# Patient Record
Sex: Male | Born: 1985 | Race: White | Hispanic: No | Smoking: Current every day smoker
Health system: Southern US, Community
[De-identification: ages and names within clinical notes are randomized; demographics above are authoritative.]

## PROBLEM LIST (undated history)

## (undated) HISTORY — PX: BACK SURGERY: SHX140

---

## 2011-02-11 ENCOUNTER — Emergency Department (HOSPITAL_COMMUNITY)
Admission: EM | Admit: 2011-02-11 | Discharge: 2011-02-11 | Disposition: A | Discharge status: Home / Self Care | Payer: Self-pay | Attending: Emergency Medicine | Admitting: Emergency Medicine

## 2011-02-11 ENCOUNTER — Other Ambulatory Visit: Payer: Self-pay

## 2011-02-11 ENCOUNTER — Encounter (HOSPITAL_COMMUNITY): Payer: Self-pay

## 2011-02-11 DIAGNOSIS — R002 Palpitations: Secondary | ICD-10-CM | POA: Insufficient documentation

## 2011-02-11 DIAGNOSIS — R079 Chest pain, unspecified: Secondary | ICD-10-CM | POA: Insufficient documentation
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
## 2011-02-11 NOTE — ED Notes (Signed)
Pt states that he has been having "fast heart beat for a few weeks", pt states that he can not feel the fast rate in his pulse on his wrist but can feel the pounding of the heart at his chest area. Pt states that he will wake up fine and then the palpations will start as the day continues. Pt will have chest pain at times when the palpitations area bad, pt states that he had an ekg at his pcp office two years ago for the same and was told that it was anxiety related, no further testing was done.

## 2011-02-11 NOTE — ED Notes (Signed)
Pt assisted to restroom, tolerated activity well, pt and family updated on plan of care

## 2011-02-11 NOTE — ED Notes (Signed)
Pt presents with a feeling of "heart beating hard" but pt states when he checks his pulse he doesn't feel it in his pulse. Pt states he experiences chest pain during these episodes sometimes.

## 2011-03-07 ENCOUNTER — Emergency Department (HOSPITAL_COMMUNITY)
Admission: EM | Admit: 2011-03-07 | Discharge: 2011-03-07 | Disposition: A | Discharge status: Home / Self Care | Payer: Self-pay

## 2011-03-07 NOTE — ED Notes (Signed)
Called pt in all waiting areas, no answer 

## 2011-03-08 ENCOUNTER — Emergency Department (HOSPITAL_COMMUNITY): Payer: Self-pay

## 2011-03-08 ENCOUNTER — Emergency Department (HOSPITAL_COMMUNITY)
Admission: EM | Admit: 2011-03-08 | Discharge: 2011-03-08 | Disposition: A | Discharge status: Home / Self Care | Payer: Self-pay | Attending: Emergency Medicine | Admitting: Emergency Medicine

## 2011-03-08 ENCOUNTER — Encounter (HOSPITAL_COMMUNITY): Payer: Self-pay | Admitting: *Deleted

## 2011-03-08 DIAGNOSIS — F17200 Nicotine dependence, unspecified, uncomplicated: Secondary | ICD-10-CM | POA: Insufficient documentation

## 2011-03-08 DIAGNOSIS — Y999 Unspecified external cause status: Secondary | ICD-10-CM | POA: Insufficient documentation

## 2011-03-08 DIAGNOSIS — S90129A Contusion of unspecified lesser toe(s) without damage to nail, initial encounter: Secondary | ICD-10-CM | POA: Insufficient documentation

## 2011-03-08 DIAGNOSIS — Z79899 Other long term (current) drug therapy: Secondary | ICD-10-CM | POA: Insufficient documentation

## 2011-03-08 DIAGNOSIS — W208XXA Other cause of strike by thrown, projected or falling object, initial encounter: Secondary | ICD-10-CM | POA: Insufficient documentation

## 2011-03-08 DIAGNOSIS — S90112A Contusion of left great toe without damage to nail, initial encounter: Secondary | ICD-10-CM

## 2011-03-08 DIAGNOSIS — Y92009 Unspecified place in unspecified non-institutional (private) residence as the place of occurrence of the external cause: Secondary | ICD-10-CM | POA: Insufficient documentation

## 2011-03-08 MED ORDER — MECLIZINE HCL 12.5 MG PO TABS: 25.0000 mg | ORAL_TABLET | Freq: Once | ORAL | Status: DC

## 2011-03-08 MED ORDER — ACETAMINOPHEN 500 MG PO TABS
1000.0000 mg | ORAL_TABLET | Freq: Once | ORAL | Status: AC
  Administered 2011-03-08: 1000 mg via ORAL
  Filled 2011-03-08: qty 2

## 2011-03-08 MED ORDER — TRAMADOL-ACETAMINOPHEN 37.5-325 MG PO TABS: ORAL_TABLET | ORAL | Status: AC

## 2011-03-08 MED ORDER — TRAMADOL HCL 50 MG PO TABS
100.0000 mg | ORAL_TABLET | Freq: Once | ORAL | Status: AC
  Administered 2011-03-08: 100 mg via ORAL
  Filled 2011-03-08: qty 2

## 2011-03-08 MED ORDER — IBUPROFEN 800 MG PO TABS
800.0000 mg | ORAL_TABLET | Freq: Once | ORAL | Status: AC
  Administered 2011-03-08: 800 mg via ORAL
  Filled 2011-03-08: qty 1
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
## 2012-06-29 IMAGING — CR DG FOOT COMPLETE 3+V*L*
3 series · 3 of 3 positions shown · non-contrast
Comparison: None.

CLINICAL DATA: Left ankle and foot pain.

LEFT FOOT - COMPLETE 3+ VIEW

[view not recorded (1 of 3)]
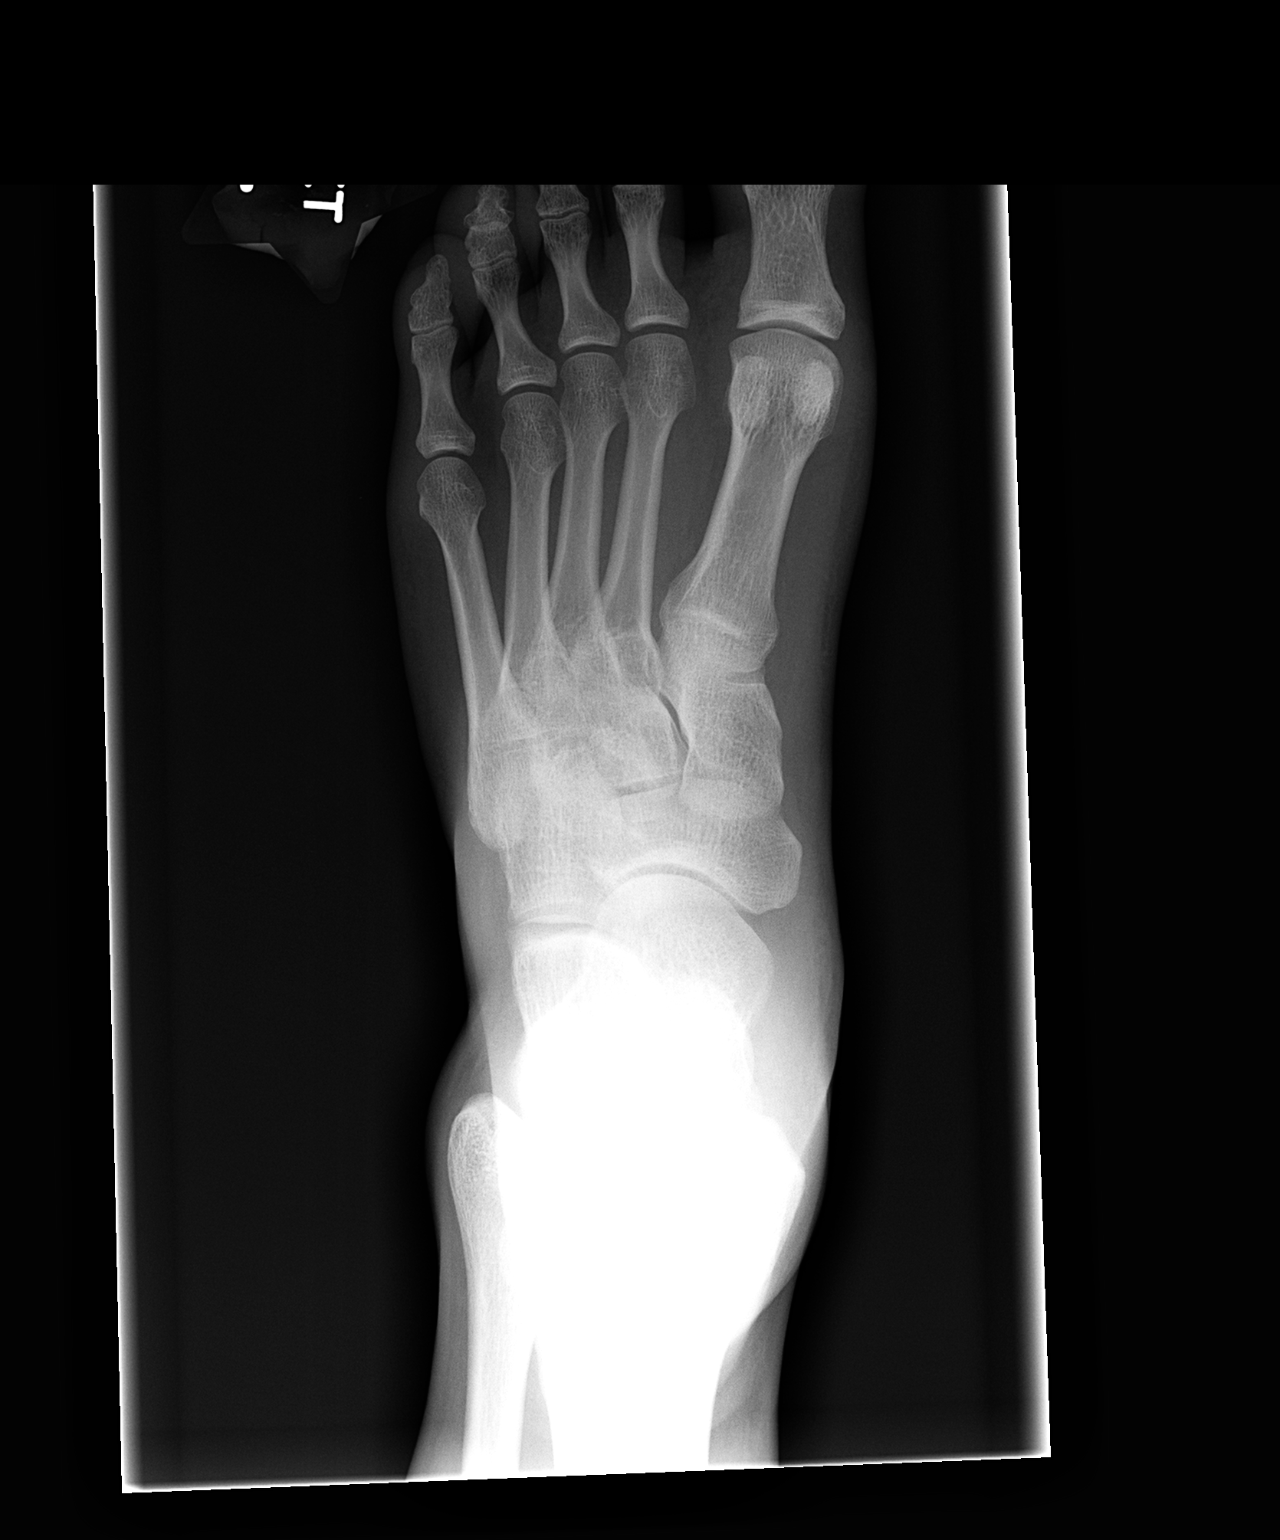

[view not recorded (2 of 3)]
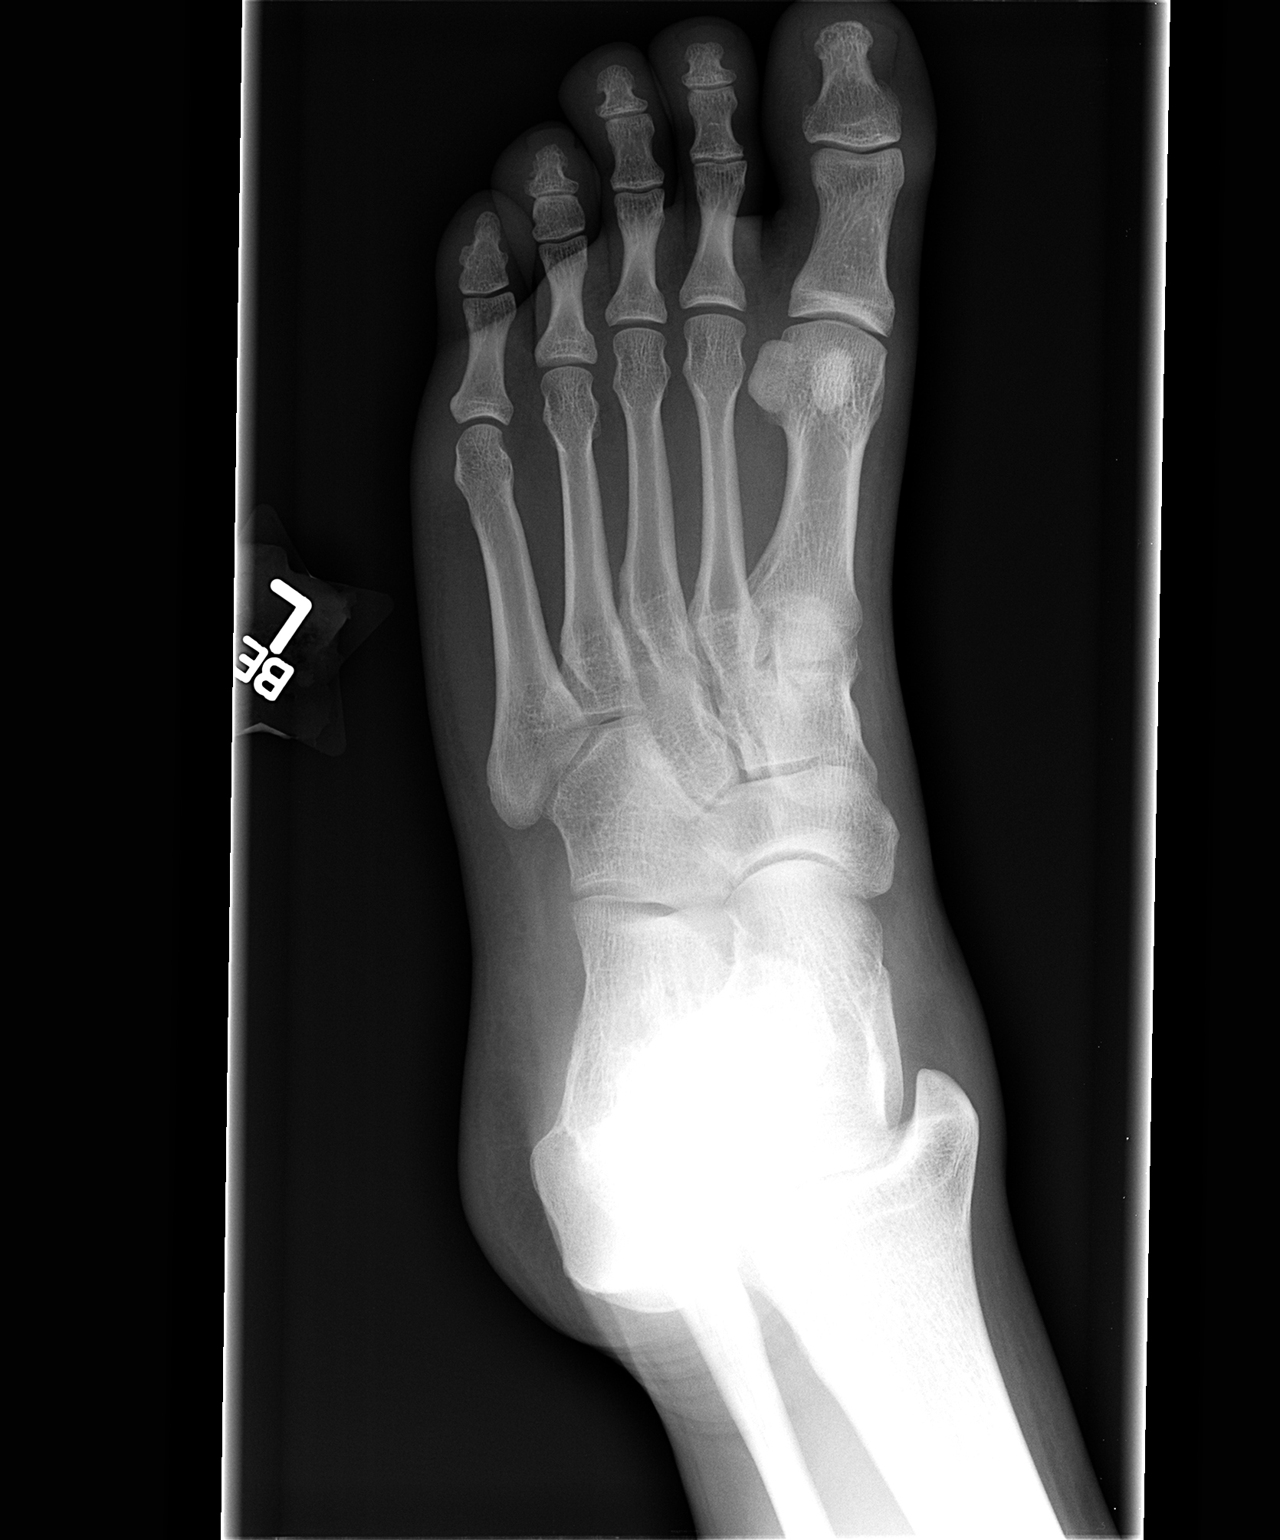

[view not recorded (3 of 3)]
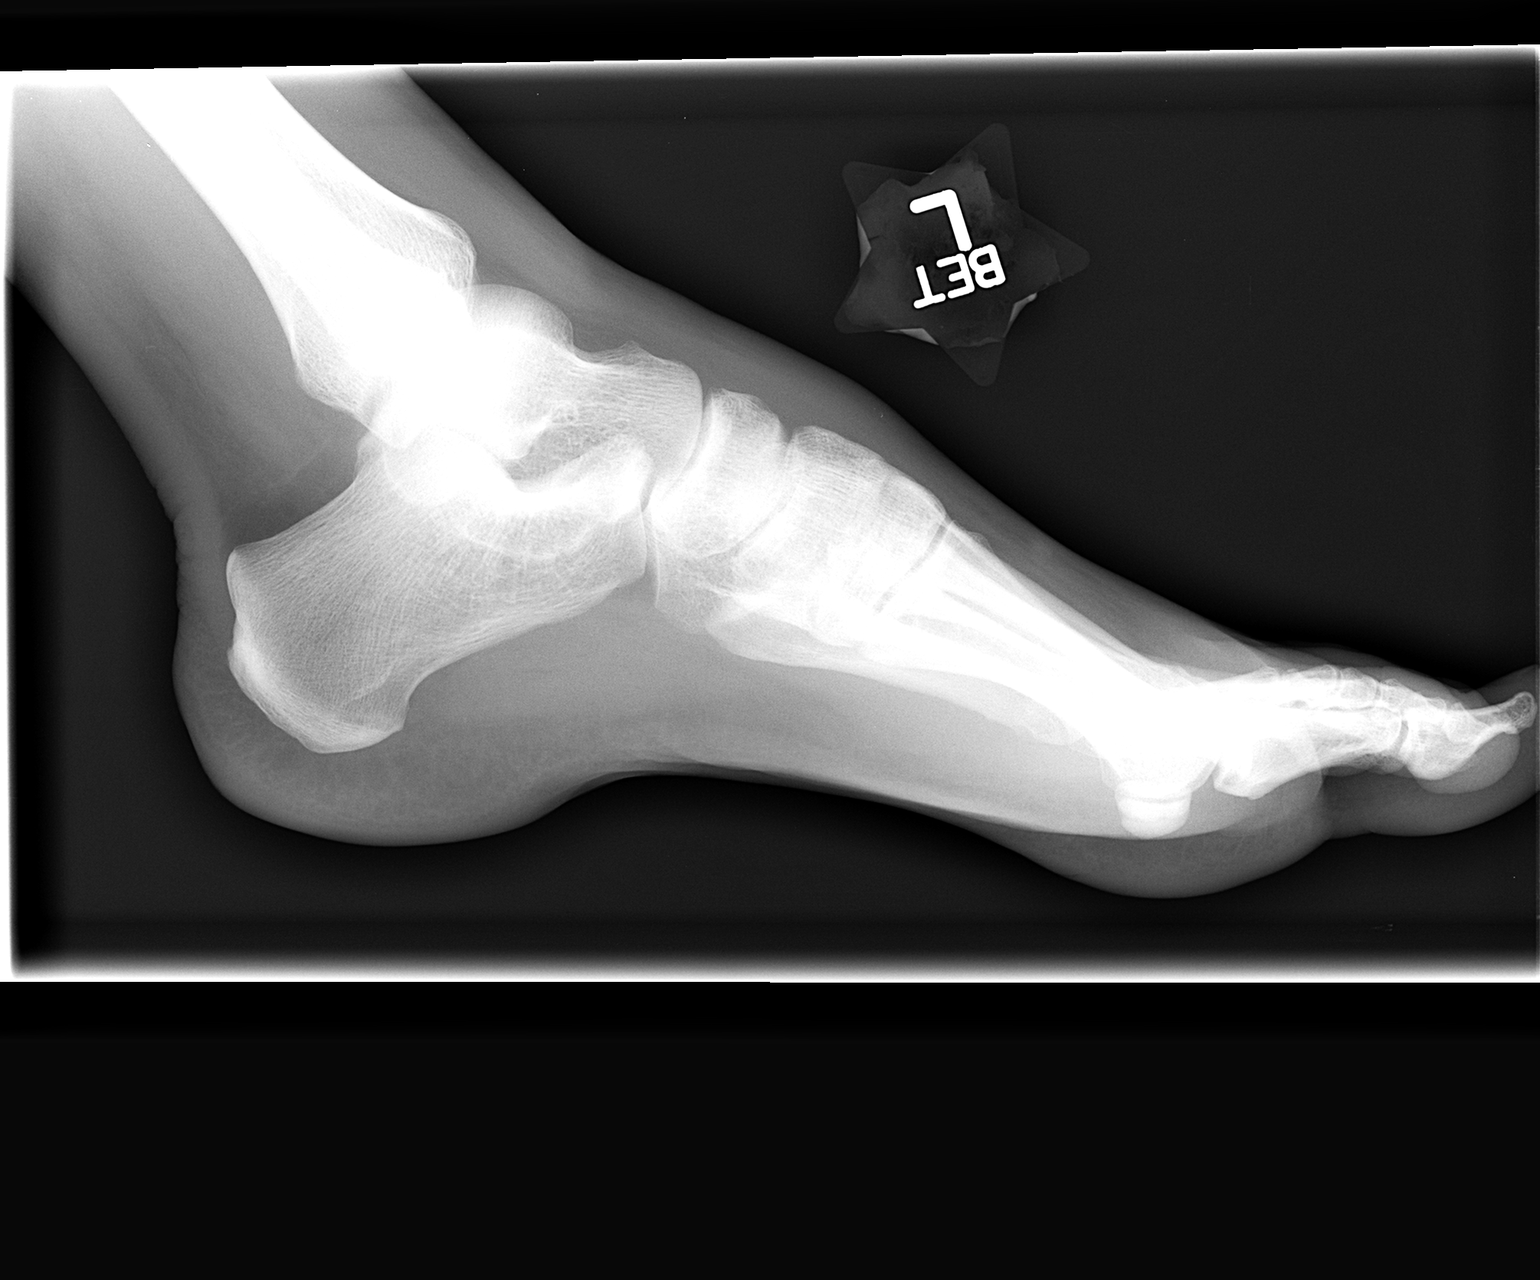

[3 of 3 positions shown; findings below may reference images not displayed]

FINDINGS: No acute fracture or dislocation identified.  There is
mild soft tissue swelling overlying the dorsum of the foot.
IMPRESSION: Mild soft tissue swelling overlying the dorsum of the foot.  No
acute osseous abnormality identified. If clinical concern for a
fracture persists, recommend a repeat radiograph in 5-10 days to
evaluate for interval change or callus formation.

## 2012-10-10 ENCOUNTER — Encounter (HOSPITAL_COMMUNITY): Payer: Self-pay

## 2012-10-10 ENCOUNTER — Emergency Department (HOSPITAL_COMMUNITY)
Admission: EM | Admit: 2012-10-10 | Discharge: 2012-10-10 | Disposition: A | Discharge status: Home / Self Care | Payer: Self-pay | Attending: Emergency Medicine | Admitting: Emergency Medicine

## 2012-10-10 ENCOUNTER — Emergency Department (HOSPITAL_COMMUNITY): Payer: Self-pay

## 2012-10-10 DIAGNOSIS — F17200 Nicotine dependence, unspecified, uncomplicated: Secondary | ICD-10-CM | POA: Insufficient documentation

## 2012-10-10 DIAGNOSIS — M545 Low back pain, unspecified: Secondary | ICD-10-CM | POA: Insufficient documentation

## 2012-10-10 DIAGNOSIS — M436 Torticollis: Secondary | ICD-10-CM | POA: Insufficient documentation

## 2012-10-10 DIAGNOSIS — Z9889 Other specified postprocedural states: Secondary | ICD-10-CM | POA: Insufficient documentation

## 2012-10-10 MED ORDER — OXYCODONE HCL 5 MG PO TABS: 5.0000 mg | ORAL_TABLET | ORAL | Status: DC | PRN

## 2012-10-10 MED ORDER — CYCLOBENZAPRINE HCL 5 MG PO TABS: 5.0000 mg | ORAL_TABLET | Freq: Three times a day (TID) | ORAL | Status: DC | PRN

## 2012-10-10 MED ORDER — PREDNISONE 10 MG PO TABS: ORAL_TABLET | ORAL | Status: DC

## 2012-10-10 NOTE — ED Notes (Signed)
Pt reports neck pain off/on,  Pain returned 2 days ago, denies any new or recent injuries.

## 2012-10-15 NOTE — ED Provider Notes (Signed)
CSN: [id number]     Arrival date & time 10/10/12  1042 History   First MD Initiated Contact with Patient 10/10/12 1104     Chief Complaint  Patient presents with  . Neck Pain   (Consider location/radiation/quality/duration/timing/severity/associated sxs/prior Treatment) Patient is a 27 y.o. male presenting with neck pain. The history is provided by the patient.  Neck Pain Pain location:  L side Quality:  Cramping, stiffness and shooting Stiffness is present:  All day Pain radiates to:  Does not radiate Pain severity:  Moderate Pain is:  Same all the time Onset quality:  Unable to specify (He woke 2 days ago with pain and stiffness) Duration:  2 days Timing:  Constant Progression:  Unchanged Chronicity:  Recurrent (He reports prior episodes of similar neck pain.  He denies injury.) Relieved by:  Nothing Worsened by:  Twisting Ineffective treatments:  NSAIDs Associated symptoms: no fever, no headaches, no numbness, no paresis, no tingling and no weakness   Risk factors: no hx of head and neck radiation, no hx of spinal trauma and no recent head injury     History reviewed. No pertinent past medical history. Past Surgical History  Procedure Laterality Date  . Back surgery     No family history on file. History  Substance Use Topics  . Smoking status: Current Every Day Smoker -- 1.00 packs/day    Types: Cigarettes  . Smokeless tobacco: Not on file  . Alcohol Use: No    Review of Systems  Constitutional: Negative for fever.  HENT: Positive for neck pain.   Musculoskeletal: Positive for back pain. Negative for myalgias.       He reports daily low back pain since surgery years ago which is not new or different then his norm.  Neurological: Negative for tingling, weakness, numbness and headaches.    Allergies  Food  Home Medications   Current Outpatient Rx  Name  Route  Sig  Dispense  Refill  . ibuprofen (ADVIL,MOTRIN) 200 MG tablet   Oral   Take 800 mg by mouth  every 6 (six) hours as needed. For pain         . cyclobenzaprine (FLEXERIL) 5 MG tablet   Oral   Take 1 tablet (5 mg total) by mouth 3 (three) times daily as needed for muscle spasms.   15 tablet   0   . oxyCODONE (ROXICODONE) 5 MG immediate release tablet   Oral   Take 1 tablet (5 mg total) by mouth every 4 (four) hours as needed for pain.   15 tablet   0   . predniSONE (DELTASONE) 10 MG tablet      6, 5, 4, 3, 2 then 1 tablet by mouth daily for 6 days total.   21 tablet   0    BP 133/94  Pulse 90  Temp(Src) 98.2 F (36.8 C) (Oral)  Resp 18  Ht 6\' 1"  (1.854 m)  Wt 185 lb (83.915 kg)  BMI 24.41 kg/m2  SpO2 100% Physical Exam  Nursing note and vitals reviewed. Constitutional: He appears well-developed and well-nourished.  HENT:  Head: Normocephalic.  Eyes: Conjunctivae are normal.  Neck: Muscular tenderness present. No spinous process tenderness present. No rigidity. Decreased range of motion present. No edema and no erythema present.  Pt has reduced cervical ROM which is worsened with left rotation.  TTP left trapezius.  He has equal grip strength. NO midline tenderness.  He can flex and extend which increases pain to a lesser degree.  Cardiovascular: Normal rate and intact distal pulses.   Pedal pulses normal.  Pulmonary/Chest: Effort normal.  Abdominal: Soft. Bowel sounds are normal. He exhibits no distension and no mass.  Musculoskeletal: He exhibits no edema.       Lumbar back: He exhibits tenderness. He exhibits no swelling, no edema and no spasm.  Neurological: He is alert. He has normal strength. He displays no atrophy and no tremor. No sensory deficit. Gait normal.  Reflex Scores:      Patellar reflexes are 2+ on the right side and 2+ on the left side.      Achilles reflexes are 2+ on the right side and 2+ on the left side. No strength deficit noted in hip and knee flexor and extensor muscle groups.  Ankle flexion and extension intact.  Skin: Skin is warm  and dry.  Psychiatric: He has a normal mood and affect.    ED Course  Procedures (including critical care time) Labs Review Labs Reviewed - No data to display Imaging Review No results found.  MDM   1. Torticollis, acute    Patients labs and/or radiological studies were viewed and considered during the medical decision making and disposition process. Pt was prescribed prednisone taper, flexeril and oxycodone,  Encouraged heat therapy followed by ROM to stretch out the muscle spasm.  He requested referral to a local neurosurgeon for f/u care also for his lower back (surgery performed out of area).  Referral given to [facility] for f/u care.    No neuro deficit on exam or by history to suggest emergent or surgical presentation.  Also discussed worsened sx that should prompt immediate re-evaluation including distal weakness or numbness.         Burgess Amor, PA-C 10/15/12 1255

## 2012-10-18 NOTE — ED Provider Notes (Signed)
Medical screening examination/treatment/procedure(s) were performed by non-physician practitioner and as supervising physician I was immediately available for consultation/collaboration. Myran Arcia, MD, FACEP   [address] Laurence Crofford, MD 10/18/12 0844 

## 2013-06-05 ENCOUNTER — Emergency Department (HOSPITAL_COMMUNITY)
Admission: EM | Admit: 2013-06-05 | Discharge: 2013-06-05 | Disposition: A | Discharge status: Home / Self Care | Payer: Self-pay | Attending: Emergency Medicine | Admitting: Emergency Medicine

## 2013-06-05 ENCOUNTER — Encounter (HOSPITAL_COMMUNITY): Payer: Self-pay | Admitting: Emergency Medicine

## 2013-06-05 DIAGNOSIS — Z791 Long term (current) use of non-steroidal anti-inflammatories (NSAID): Secondary | ICD-10-CM | POA: Insufficient documentation

## 2013-06-05 DIAGNOSIS — Z9889 Other specified postprocedural states: Secondary | ICD-10-CM | POA: Insufficient documentation

## 2013-06-05 DIAGNOSIS — F17200 Nicotine dependence, unspecified, uncomplicated: Secondary | ICD-10-CM | POA: Insufficient documentation

## 2013-06-05 DIAGNOSIS — M62838 Other muscle spasm: Secondary | ICD-10-CM | POA: Insufficient documentation

## 2013-06-05 MED ORDER — CYCLOBENZAPRINE HCL 10 MG PO TABS: 10.0000 mg | ORAL_TABLET | Freq: Two times a day (BID) | ORAL | Status: DC | PRN

## 2013-06-05 MED ORDER — IBUPROFEN 800 MG PO TABS: 800.0000 mg | ORAL_TABLET | Freq: Three times a day (TID) | ORAL | Status: DC

## 2013-06-05 NOTE — ED Notes (Signed)
Pt alert & oriented x4, stable gait. Patient given discharge instructions, paperwork & prescription(s). Patient  instructed to stop at the registration desk to finish any additional paperwork. Patient verbalized understanding. Pt left department w/ no further questions. 

## 2013-06-05 NOTE — Discharge Instructions (Signed)
°  Muscle Cramps and Spasms °Muscle cramps and spasms occur when a muscle or muscles tighten and you have no control over this tightening (involuntary muscle contraction). They are a common problem and can develop in any muscle. The most common place is in the calf muscles of the leg. Both muscle cramps and muscle spasms are involuntary muscle contractions, but they also have differences:  °· Muscle cramps are sporadic and painful. They may last a few seconds to a quarter of an hour. Muscle cramps are often more forceful and last longer than muscle spasms. °· Muscle spasms may or may not be painful. They may also last just a few seconds or much longer. °CAUSES  °It is uncommon for cramps or spasms to be due to a serious underlying problem. In many cases, the cause of cramps or spasms is unknown. Some common causes are:  °· Overexertion.   °· Overuse from repetitive motions (doing the same thing over and over).   °· Remaining in a certain position for a long period of time.   °· Improper preparation, form, or technique while performing a sport or activity.   °· Dehydration.   °· Injury.   °· Side effects of some medicines.   °· Abnormally low levels of the salts and ions in your blood (electrolytes), especially potassium and calcium. This could happen if you are taking water pills (diuretics) or you are pregnant.   °Some underlying medical problems can make it more likely to develop cramps or spasms. These include, but are not limited to:  °· Diabetes.   °· Parkinson disease.   °· Hormone disorders, such as thyroid problems.   °· Alcohol abuse.   °· Diseases specific to muscles, joints, and bones.   °· Blood vessel disease where not enough blood is getting to the muscles.   °HOME CARE INSTRUCTIONS  °· Stay well hydrated. Drink enough water and fluids to keep your urine clear or pale yellow. °· It may be helpful to massage, stretch, and relax the affected muscle. °· For tight or tense muscles, use a warm towel, heating  pad, or hot shower water directed to the affected area. °· If you are sore or have pain after a cramp or spasm, applying ice to the affected area may relieve discomfort. °· Put ice in a plastic bag. °· Place a towel between your skin and the bag. °· Leave the ice on for 15-20 minutes, 03-04 times a day. °· Medicines used to treat a known cause of cramps or spasms may help reduce their frequency or severity. Only take over-the-counter or prescription medicines as directed by your caregiver. °SEEK MEDICAL CARE IF:  °Your cramps or spasms get more severe, more frequent, or do not improve over time.  °MAKE SURE YOU:  °· Understand these instructions. °· Will watch your condition. °· Will get help right away if you are not doing well or get worse. °Document Released: 07/09/2001 Document Revised: 05/14/2012 Document Reviewed: 01/04/2012 °[employer]® Patient Information ©2014 [employer]. ° ° °

## 2013-06-05 NOTE — ED Notes (Signed)
Pt c/o left side neck pain that began yesterday while at work. Pt describes pain as tight and sharp.

## 2013-06-05 NOTE — ED Provider Notes (Signed)
CSN: [id number]     Arrival date & time 06/05/13  0911 History   First MD Initiated Contact with Patient 06/05/13 0915     Chief Complaint  Patient presents with  . Neck Pain     (Consider location/radiation/quality/duration/timing/severity/associated sxs/prior Treatment) HPI Comments: Pt c/o left sided neck pain that he noticed at work yesterday and the symptoms worsened thru the night. Pt states the he can't turn his head from side to side. Denies numbness or weakness. No history of drug use. No fever. No recent injury. Has previous history of similar symptoms  The history is provided by the patient. No language interpreter was used.    History reviewed. No pertinent past medical history. Past Surgical History  Procedure Laterality Date  . Back surgery     History reviewed. No pertinent family history. History  Substance Use Topics  . Smoking status: Current Every Day Smoker -- 1.00 packs/day    Types: Cigarettes  . Smokeless tobacco: Not on file  . Alcohol Use: No    Review of Systems  Constitutional: Negative.   Respiratory: Negative.   Cardiovascular: Negative.       Allergies  Food and Tylenol  Home Medications   Prior to Admission medications   Medication Sig Start Date End Date Taking? Authorizing Provider  ibuprofen (ADVIL,MOTRIN) 200 MG tablet Take 800 mg by mouth every 6 (six) hours as needed. For pain   Yes Historical Provider, MD  naproxen sodium (ALEVE) 220 MG tablet Take 440 mg by mouth 2 (two) times daily as needed (pain).   Yes Historical Provider, MD  cyclobenzaprine (FLEXERIL) 10 MG tablet Take 1 tablet (10 mg total) by mouth 2 (two) times daily as needed for muscle spasms. 06/05/13   Teressa LowerVrinda Robert Sunga, NP  ibuprofen (ADVIL,MOTRIN) 800 MG tablet Take 1 tablet (800 mg total) by mouth 3 (three) times daily. 06/05/13   Teressa LowerVrinda Vieno Tarrant, NP   BP 145/98  Pulse 94  Temp(Src) 97.8 F (36.6 C)  Resp 18  SpO2 100% Physical Exam  Nursing note and vitals  reviewed. Constitutional: He is oriented to person, place, and time. He appears well-developed.  Cardiovascular: Normal rate and regular rhythm.   Pulmonary/Chest: Effort normal and breath sounds normal.  Musculoskeletal:  Left paraspinal tender. Unable to turn side to side. No meningeal signs  Neurological: He is alert and oriented to person, place, and time.  Grip strength is equal  Skin: Skin is warm and dry.    ED Course  Procedures (including critical care time) Labs Review Labs Reviewed - No data to display  Imaging Review No results found.   EKG Interpretation None      MDM   Final diagnoses:  Muscle spasm    No neuro deficits. Pt has previous imaging that was normal. Will treat symptomatically and have follow up    Teressa LowerVrinda Rasul Decola, NP 06/05/13 1007

## 2013-06-06 NOTE — ED Provider Notes (Signed)
Medical screening examination/treatment/procedure(s) were performed by non-physician practitioner and as supervising physician I was immediately available for consultation/collaboration.   EKG Interpretation None       Richard HutchingBrian Marisha Renier, MD 06/06/13 1528

## 2014-02-10 ENCOUNTER — Emergency Department (HOSPITAL_COMMUNITY): Payer: Medicaid Other

## 2014-02-10 ENCOUNTER — Encounter (HOSPITAL_COMMUNITY): Payer: Self-pay | Admitting: *Deleted

## 2014-02-10 ENCOUNTER — Emergency Department (HOSPITAL_COMMUNITY)
Admission: EM | Admit: 2014-02-10 | Discharge: 2014-02-10 | Disposition: A | Discharge status: Home / Self Care | Payer: Medicaid Other | Attending: Emergency Medicine | Admitting: Emergency Medicine

## 2014-02-10 DIAGNOSIS — Z79899 Other long term (current) drug therapy: Secondary | ICD-10-CM | POA: Diagnosis not present

## 2014-02-10 DIAGNOSIS — M5412 Radiculopathy, cervical region: Secondary | ICD-10-CM

## 2014-02-10 DIAGNOSIS — Z7952 Long term (current) use of systemic steroids: Secondary | ICD-10-CM | POA: Diagnosis not present

## 2014-02-10 DIAGNOSIS — Z791 Long term (current) use of non-steroidal anti-inflammatories (NSAID): Secondary | ICD-10-CM | POA: Insufficient documentation

## 2014-02-10 DIAGNOSIS — M542 Cervicalgia: Secondary | ICD-10-CM | POA: Diagnosis present

## 2014-02-10 DIAGNOSIS — Z720 Tobacco use: Secondary | ICD-10-CM | POA: Insufficient documentation

## 2014-02-10 DIAGNOSIS — R52 Pain, unspecified: Secondary | ICD-10-CM

## 2014-02-10 MED ORDER — PREDNISONE 10 MG PO TABS
60.0000 mg | ORAL_TABLET | Freq: Once | ORAL | Status: AC
  Administered 2014-02-10: 60 mg via ORAL
  Filled 2014-02-10 (×2): qty 1

## 2014-02-10 MED ORDER — OXYCODONE HCL 5 MG PO TABS
5.0000 mg | ORAL_TABLET | Freq: Once | ORAL | Status: AC
  Administered 2014-02-10: 5 mg via ORAL
  Filled 2014-02-10: qty 1

## 2014-02-10 MED ORDER — METHOCARBAMOL 500 MG PO TABS: 1000.0000 mg | ORAL_TABLET | Freq: Four times a day (QID) | ORAL | Status: AC

## 2014-02-10 MED ORDER — PREDNISONE 10 MG PO TABS: ORAL_TABLET | ORAL | Status: DC

## 2014-02-10 MED ORDER — OXYCODONE HCL 5 MG PO TABS: 5.0000 mg | ORAL_TABLET | ORAL | Status: DC | PRN

## 2014-02-10 MED ORDER — METHOCARBAMOL 500 MG PO TABS
1000.0000 mg | ORAL_TABLET | Freq: Once | ORAL | Status: AC
  Administered 2014-02-10: 1000 mg via ORAL
  Filled 2014-02-10: qty 2

## 2014-02-10 NOTE — ED Provider Notes (Signed)
CSN: [id number]     Arrival date & time 02/10/14  1637 History  This chart was scribed for non-physician practitioner, Burgess Amor, PA-C, working with Linwood Dibbles, MD, by Ronney Lion, ED Scribe. This patient was seen in room APFT21/APFT21 and the patient's care was started at 6:51 PM.    Chief Complaint  Patient presents with  . Neck Pain   The history is provided by the patient. No language interpreter was used.     HPI Comments: Richard Allison is a 29 y.o. male with a history of L4-L5 fusion surgery who presents to the Emergency Department complaining of chronic, constant, waxing and waning neck pain since August 2014. Patient states that his pain has worsened this past week after moving some Christmas decor boxes into a shed this weekend,  now radiating pain down his entire left arm to his fingertips. He reports intermittent numbness in his fingertips.  Turning his head and shrugging his shoulders also exacerbate the pain. Patient has tried Aleve (last taken 5 hours ago) and ibuprofen (last taken 6 hours ago) with mild relief. Patient does not have a PCP. He was seen at the health department, but they denied prescribing any pain medication. Dr. Jacolyn Reedy in [place] was his back surgeon. Patient is an Personnel officer.  History reviewed. No pertinent past medical history. Past Surgical History  Procedure Laterality Date  . Back surgery     History reviewed. No pertinent family history. History  Substance Use Topics  . Smoking status: Current Every Day Smoker -- 1.00 packs/day    Types: Cigarettes  . Smokeless tobacco: Not on file  . Alcohol Use: No    Review of Systems  Constitutional: Negative for fever.  Musculoskeletal: Positive for myalgias, neck pain and neck stiffness.  Neurological: Negative for weakness and numbness.  All other systems reviewed and are negative.     Allergies  Food and Tylenol  Home Medications   Prior to Admission medications   Medication Sig Start  Date End Date Taking? Authorizing Provider  ibuprofen (ADVIL,MOTRIN) 200 MG tablet Take 800 mg by mouth every 6 (six) hours as needed. For pain   Yes Historical Provider, MD  naproxen sodium (ALEVE) 220 MG tablet Take 440 mg by mouth 2 (two) times daily as needed (pain).   Yes Historical Provider, MD  cyclobenzaprine (FLEXERIL) 10 MG tablet Take 1 tablet (10 mg total) by mouth 2 (two) times daily as needed for muscle spasms. Patient not taking: Reported on 02/10/2014 06/05/13   Teressa Lower, NP  ibuprofen (ADVIL,MOTRIN) 800 MG tablet Take 1 tablet (800 mg total) by mouth 3 (three) times daily. Patient not taking: Reported on 02/10/2014 06/05/13   Teressa Lower, NP  methocarbamol (ROBAXIN) 500 MG tablet Take 2 tablets (1,000 mg total) by mouth 4 (four) times daily. 02/10/14 02/20/14  Burgess Amor, PA-C  oxyCODONE (ROXICODONE) 5 MG immediate release tablet Take 1 tablet (5 mg total) by mouth every 4 (four) hours as needed for severe pain. 02/10/14   Burgess Amor, PA-C  predniSONE (DELTASONE) 10 MG tablet 6, 5, 4, 3, 2 then 1 tablet by mouth daily for 6 days total. 02/10/14   Burgess Amor, PA-C   BP 139/83 mmHg  Pulse 92  Temp(Src) 98.6 F (37 C) (Oral)  Resp 18  Ht 6' (1.829 m)  Wt 190 lb (86.183 kg)  BMI 25.76 kg/m2  SpO2 100% Physical Exam  Constitutional: He appears well-developed and well-nourished.  HENT:  Head: Atraumatic.  Neck: Normal range  of motion.  Cardiovascular:  Pulses equal bilaterally  Musculoskeletal: He exhibits tenderness.  Tender to palpation to midline, around C6 to C7 vertebrae, with radiation of pain into left upper shoulder and trapezius. Increased pain with neck rotation.  Neurological: He is alert. He has normal strength. He displays normal reflexes. No sensory deficit.  Slight decreased left grip strength. (Pt right handed). Decreased sensation to light touch in left fingertips sparing thumb. <3 second distal cap refill. 2+ right bicep DTR. 1.5-2+ left bicep DTR.    Skin: Skin is warm and dry.  Psychiatric: He has a normal mood and affect.  Nursing note and vitals reviewed.   ED Course  Procedures (including critical care time)  DIAGNOSTIC STUDIES: Oxygen Saturation is 100% on room air, normal by my interpretation.    COORDINATION OF CARE: 7:01 PM - Discussed treatment plan with pt at bedside which includes Prednisone and muscle relaxers and pt agreed to plan. Patient advised to get an MRI, although he expresses complications due to his insurance status.   Labs Review Labs Reviewed - No data to display  Imaging Review Dg Cervical Spine Complete  02/10/2014   CLINICAL DATA:  Pain in the left posterior aspect of the neck since August of 2015. Pain has been constant since December. No injury until today. Patient was lifting boxes and felt a pulling sensation in the left-sided neck. Pain radiates into the left upper extremity.  EXAM: CERVICAL SPINE  4+ VIEWS  COMPARISON:  10/10/2012  FINDINGS: There is no evidence of cervical spine fracture or prevertebral soft tissue swelling. Alignment is normal. No other significant bone abnormalities are identified.  IMPRESSION: Negative cervical spine radiographs.   Electronically Signed   By: Rosalie GumsBeth  Brown M.D.   On: 02/10/2014 19:50     EKG Interpretation None      MDM   Final diagnoses:  Cervical radiculopathy  Pain   Pt with cervical radiculpathy and weakness/numbness left upper extremity.  Patients labs and/or radiological studies were viewed and considered during the medical decision making and disposition process. Pt was discussed with Dr. Lynelle DoctorKnapp.  Outpatient MRI ordered with planned f/u with his pcp (on medicaid - [facility]).  Referral will need to come from his pcp to neurosurgery if MRI indicates need for specialist.  Pt understands this plan.  He was prescribed prednisone, oxycodone and robaxin.   I personally performed the services described in this documentation, which was scribed in my presence. The  recorded information has been reviewed and is accurate.    Burgess AmorJulie Shandrika Ambers, PA-C 02/11/14 1515  Linwood DibblesJon Knapp, MD 02/12/14 [phone]

## 2014-02-10 NOTE — ED Notes (Signed)
Pain lt side of neck for "months", worse today.  Radiation down lt arm.  Nausea.

## 2014-02-10 NOTE — ED Notes (Signed)
Pt with chronic neck pain since August but helped move some stuff in a shed and neck pain has gotten worse, pt denies seeing anyone for his chronic neck pain

## 2014-02-10 NOTE — Discharge Instructions (Signed)
Cervical Radiculopathy Cervical radiculopathy happens when a nerve in the neck is pinched or bruised by a slipped (herniated) disk or by arthritic changes in the bones of the cervical spine. This can occur due to an injury or as part of the normal aging process. Pressure on the cervical nerves can cause pain or numbness that runs from your neck all the way down into your arm and fingers. CAUSES  There are many possible causes, including:  Injury.  Muscle tightness in the neck from overuse.  Swollen, painful joints (arthritis).  Breakdown or degeneration in the bones and joints of the spine (spondylosis) due to aging.  Bone spurs that may develop near the cervical nerves. SYMPTOMS  Symptoms include pain, weakness, or numbness in the affected arm and hand. Pain can be severe or irritating. Symptoms may be worse when extending or turning the neck. DIAGNOSIS  Your caregiver will ask about your symptoms and do a physical exam. He or she may test your strength and reflexes. X-rays, CT scans, and MRI scans may be needed in cases of injury or if the symptoms do not go away after a period of time. Electromyography (EMG) or nerve conduction testing may be done to study how your nerves and muscles are working. TREATMENT  Your caregiver may recommend certain exercises to help relieve your symptoms. Cervical radiculopathy can, and often does, get better with time and treatment. If your problems continue, treatment options may include:  Wearing a soft collar for short periods of time.  Physical therapy to strengthen the neck muscles.  Medicines, such as nonsteroidal anti-inflammatory drugs (NSAIDs), oral corticosteroids, or spinal injections.  Surgery. Different types of surgery may be done depending on the cause of your problems. HOME CARE INSTRUCTIONS   Put ice on the affected area.  Put ice in a plastic bag.  Place a towel between your skin and the bag.  Leave the ice on for 15-20 minutes,  03-04 times a day or as directed by your caregiver.  If ice does not help, you can try using heat. Take a warm shower or bath, or use a hot water bottle as directed by your caregiver.  You may try a gentle neck and shoulder massage.  Use a flat pillow when you sleep.  Only take over-the-counter or prescription medicines for pain, discomfort, or fever as directed by your caregiver.  If physical therapy was prescribed, follow your caregiver's directions.  If a soft collar was prescribed, use it as directed. SEEK IMMEDIATE MEDICAL CARE IF:   Your pain gets much worse and cannot be controlled with medicines.  You have weakness or numbness in your hand, arm, face, or leg.  You have a high fever or a stiff, rigid neck.  You lose bowel or bladder control (incontinence).  You have trouble with walking, balance, or speaking. MAKE SURE YOU:   Understand these instructions.  Will watch your condition.  Will get help right away if you are not doing well or get worse. Document Released: 10/12/2000 Document Revised: 04/11/2011 Document Reviewed: 08/31/2010 [facility] Patient Information 2015 [employer] [place]. This information is not intended to replace advice given to you by your health care provider. Make sure you discuss any questions you have with your health care provider.  Take your next dose of prednisone tomorrow evening.  Use the the other medicines as directed.  Do not drive within 4 hours of taking oxycodone as this will make you drowsy.  Avoid lifting,  Bending,  Twisting or any  other activity that worsens your pain over the next week.  Apply an  icepack  to your neck for 10-15 minutes every 2 hours for the next 2 days.  You should get rechecked if your symptoms are not better over the next 5 days,  Or you develop increased pain or weakness.  You are being scheduled for an MRI as discussed.  You should be contacted by radiology department to schedule this appointment.

## 2014-02-10 NOTE — ED Notes (Signed)
Pt verbalized understanding of no driving and to use caution within 4 hours of taking pain meds due to meds cause drowsiness 

## 2014-02-18 ENCOUNTER — Ambulatory Visit (HOSPITAL_COMMUNITY): Admission: RE | Admit: 2014-02-18 | Payer: Medicaid Other | Source: Ambulatory Visit

## 2014-05-30 ENCOUNTER — Other Ambulatory Visit (HOSPITAL_COMMUNITY): Payer: Self-pay | Admitting: Family

## 2014-05-30 DIAGNOSIS — M542 Cervicalgia: Secondary | ICD-10-CM

## 2014-06-05 ENCOUNTER — Ambulatory Visit (HOSPITAL_COMMUNITY)
Admission: RE | Admit: 2014-06-05 | Discharge: 2014-06-05 | Disposition: A | Discharge status: Home / Self Care | Payer: Medicaid Other | Source: Ambulatory Visit | Attending: Family | Admitting: Family

## 2014-06-05 DIAGNOSIS — M542 Cervicalgia: Secondary | ICD-10-CM | POA: Diagnosis present

## 2014-06-05 DIAGNOSIS — M4802 Spinal stenosis, cervical region: Secondary | ICD-10-CM | POA: Insufficient documentation

## 2014-06-05 DIAGNOSIS — M25512 Pain in left shoulder: Secondary | ICD-10-CM | POA: Insufficient documentation

## 2014-06-05 DIAGNOSIS — R200 Anesthesia of skin: Secondary | ICD-10-CM | POA: Insufficient documentation

## 2015-05-25 ENCOUNTER — Other Ambulatory Visit (HOSPITAL_COMMUNITY): Payer: Self-pay | Admitting: Sports Medicine

## 2015-05-25 DIAGNOSIS — M545 Low back pain, unspecified: Secondary | ICD-10-CM

## 2015-05-25 DIAGNOSIS — Z981 Arthrodesis status: Secondary | ICD-10-CM

## 2015-05-29 ENCOUNTER — Ambulatory Visit (HOSPITAL_COMMUNITY)
Admission: RE | Admit: 2015-05-29 | Discharge: 2015-05-29 | Disposition: A | Discharge status: Home / Self Care | Payer: Medicaid Other | Source: Ambulatory Visit | Attending: Sports Medicine | Admitting: Sports Medicine

## 2015-05-29 DIAGNOSIS — M545 Low back pain, unspecified: Secondary | ICD-10-CM

## 2015-05-29 DIAGNOSIS — Z981 Arthrodesis status: Secondary | ICD-10-CM | POA: Diagnosis not present

## 2015-05-29 DIAGNOSIS — M5126 Other intervertebral disc displacement, lumbar region: Secondary | ICD-10-CM | POA: Diagnosis not present

## 2015-05-29 MED ORDER — GADOBENATE DIMEGLUMINE 529 MG/ML IV SOLN
18.0000 mL | Freq: Once | INTRAVENOUS | Status: AC | PRN
  Administered 2015-05-29: 18 mL via INTRAVENOUS

## 2015-07-31 ENCOUNTER — Encounter: Payer: Self-pay | Admitting: Physical Medicine & Rehabilitation

## 2015-07-31 ENCOUNTER — Encounter: Payer: Medicaid Other | Attending: Physical Medicine & Rehabilitation | Admitting: Physical Medicine & Rehabilitation

## 2015-07-31 VITALS — BP 156/111 | HR 83 | Resp 14

## 2015-07-31 DIAGNOSIS — Z79899 Other long term (current) drug therapy: Secondary | ICD-10-CM

## 2015-07-31 DIAGNOSIS — M791 Myalgia: Secondary | ICD-10-CM | POA: Diagnosis not present

## 2015-07-31 DIAGNOSIS — G894 Chronic pain syndrome: Secondary | ICD-10-CM

## 2015-07-31 DIAGNOSIS — Z5181 Encounter for therapeutic drug level monitoring: Secondary | ICD-10-CM

## 2015-07-31 DIAGNOSIS — G479 Sleep disorder, unspecified: Secondary | ICD-10-CM | POA: Diagnosis not present

## 2015-07-31 DIAGNOSIS — Z720 Tobacco use: Secondary | ICD-10-CM

## 2015-07-31 DIAGNOSIS — M5126 Other intervertebral disc displacement, lumbar region: Secondary | ICD-10-CM | POA: Diagnosis not present

## 2015-07-31 DIAGNOSIS — F17210 Nicotine dependence, cigarettes, uncomplicated: Secondary | ICD-10-CM | POA: Diagnosis not present

## 2015-07-31 DIAGNOSIS — M4802 Spinal stenosis, cervical region: Secondary | ICD-10-CM | POA: Diagnosis not present

## 2015-07-31 DIAGNOSIS — Z716 Tobacco abuse counseling: Secondary | ICD-10-CM | POA: Insufficient documentation

## 2015-07-31 DIAGNOSIS — Z981 Arthrodesis status: Secondary | ICD-10-CM | POA: Insufficient documentation

## 2015-07-31 DIAGNOSIS — G8929 Other chronic pain: Secondary | ICD-10-CM

## 2015-07-31 DIAGNOSIS — M542 Cervicalgia: Secondary | ICD-10-CM | POA: Insufficient documentation

## 2015-07-31 DIAGNOSIS — M549 Dorsalgia, unspecified: Secondary | ICD-10-CM | POA: Diagnosis present

## 2015-07-31 DIAGNOSIS — M609 Myositis, unspecified: Secondary | ICD-10-CM

## 2015-07-31 DIAGNOSIS — IMO0001 Reserved for inherently not codable concepts without codable children: Secondary | ICD-10-CM | POA: Insufficient documentation

## 2015-07-31 MED ORDER — TRAMADOL HCL 50 MG PO TABS: 100.0000 mg | ORAL_TABLET | Freq: Three times a day (TID) | ORAL | Status: DC | PRN

## 2015-07-31 MED ORDER — AMITRIPTYLINE HCL 150 MG PO TABS: 150.0000 mg | ORAL_TABLET | Freq: Every day | ORAL | Status: DC

## 2015-07-31 MED ORDER — DULOXETINE HCL 30 MG PO CPEP: 30.0000 mg | ORAL_CAPSULE | Freq: Every day | ORAL | Status: DC

## 2015-07-31 NOTE — Progress Notes (Signed)
Subjective:    Patient ID: Richard Allison, male    DOB: 08-May-1985, 30 y.o.   MRN: [id number]  HPI  30 y/o male with pmh of tobacco abuse, back pain s/p lumbar fusion presents neck > back pain.  Neck pain located on left side.  Back pain on right side.  Started in 2001 after heavy lifting.  Back surgery was in 2009. Neck is getting progressively worse since 2012.  Prolonged activity exacerbate the pain.  Chiropractor improves the pain. Sharp pain.  Pain radiates to elbow.  Before going to the chiropractor is travel down to his hand.  Today, 8/10.  Constant.  Ice helps.  Flexaril helps pt go to sleep.  Baclofen didn't seen to help, but only took one.  Denies associated numbness, tingling, weakness.   This has resulted in poor sleep. It limits him from doing daily activities and working. It also limits his interactions with others. He notes a decrease in appetite.  Pt had C-spine MRI 06/2014 and L-spine 05/2015.  Pain Inventory Average Pain 8 Pain Right Now 8 My pain is sharp, burning, dull, stabbing and aching  In the last 24 hours, has pain interfered with the following? General activity 9 Relation with others 9 Enjoyment of life 10 What TIME of day is your pain at its worst? morning Sleep (in general) NA  Pain is worse with: bending, sitting, inactivity and some activites Pain improves with: rest and medication Relief from Meds: 9  Mobility walk without assistance how many minutes can you walk? 30 ability to climb steps?  yes do you drive?  yes Do you have any goals in this area?  yes  Function employed # of hrs/week open I need assistance with the following:  dressing, toileting and household duties Do you have any goals in this area?  yes  Neuro/Psych spasms depression anxiety  Prior Studies x-rays CT/MRI  Physicians involved in your care [facility]   Family History  Problem Relation Age of  Onset  . Heart disease Father   . Diabetes Father   . Hyperlipidemia Father    Social History   Social History  . Marital Status: Legally Separated    Spouse Name: N/A  . Number of Children: N/A  . Years of Education: N/A   Social History Main Topics  . Smoking status: Current Every Day Smoker -- 1.00 packs/day    Types: Cigarettes  . Smokeless tobacco: None  . Alcohol Use: No  . Drug Use: No  . Sexual Activity: Yes    Birth Control/ Protection: None   Other Topics Concern  . None   Social History Narrative   Past Surgical History  Procedure Laterality Date  . Back surgery     History reviewed. No pertinent past medical history. BP 156/111 mmHg  Pulse 83  Resp 14  SpO2 99%  Opioid Risk Score:   Fall Risk Score:  `1  Depression screen PHQ 2/9  Depression screen PHQ 2/9 07/31/2015  Decreased Interest 2  Down, Depressed, Hopeless 2  PHQ - 2 Score 4  Altered sleeping 3  Tired, decreased energy 3  Change in appetite 2  Feeling bad or failure about yourself  3  Trouble concentrating 2  Moving slowly or fidgety/restless 2  Suicidal thoughts 0  PHQ-9 Score 19  Difficult doing work/chores Extremely dIfficult   Review of Systems  Constitutional: Positive for appetite change.  Neurological:  Spasms   Psychiatric/Behavioral: Positive for sleep disturbance and dysphoric mood.  All other systems reviewed and are negative.     Objective:   Physical Exam Gen: NAD. Vital signs reviewed HENT: Normocephalic, Atraumatic Eyes: EOMI, Conj WNL Cardio: S1, S2 normal, RRR Pulm: B/l clear to auscultation.  Effort normal Abd: Soft, non-distended, non-tender, BS+ MSK:  Gait WNL.   TTP over C-spine, PSP, and right upper trap.    No edema.  Neuro: CN II-XII grossly intact.    Sensation intact to light touch in all UE dermatomes  Reflexes 1+ LUE, 2+RUE  Strength  5/5 in all UE myotomes  Neg Spurling's b/l, however, limitted pressure due to pain  Arm lift test  positive Skin: Warm and Dry    Assessment & Plan:  30 y/o male with pmh of tobacco abuse, back pain s/p lumbar fusion presents neck > back pain.  1. Chronic cervical neck pain  MRI 06/2014: 1. Mild central and foraminal narrowing at C3-4. 2. Mild left central and foraminal stenosis at C4-5. 3. Mild to moderate left central and mild left foraminal stenosis at C5-6. 4. Mild left central and foraminal stenosis at C6-7.  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   Cont Ice  Will refer to PT for ROM, strengthening, traction, and evaluation for TENs  Will perform UDS  Will order Tramadol 100mg  TID PRN (educated on risks/symptoms of seratonin syndrome)  Cont Baclofen 10 TID (prescribed by Ortho)  Will order Cymbalta 30mg  daily, will increase to 60 on next visit   Elavil 75mg  for 2 weeks, may increased to 150mg   2. Chronic Mechanic low back pain  MRI 05/2015: 1. Posterior lumbar interbody fusion from L4 through S1 without failure or complication. No foraminal or central canal stenosis. 2. At L2-3, there is a small left paracentral disc protrusion.  See above  3. Tobacco abuse  Educated and encouraged on cessation  4. Sleep disturbance  Cont activity  Elavil 75mg  for 2 weeks, may increased to 150mg    5. 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

## 2015-07-31 NOTE — Patient Instructions (Signed)
Participate in aquatic therapy.

## 2015-07-31 NOTE — Addendum Note (Signed)
Addended by: Kathrine HaddockWOOD, AMBER L on: 07/31/2015 02:31 PM   Modules accepted: Orders

## 2015-08-06 ENCOUNTER — Other Ambulatory Visit (HOSPITAL_COMMUNITY): Payer: Self-pay | Admitting: Sports Medicine

## 2015-08-06 DIAGNOSIS — M542 Cervicalgia: Secondary | ICD-10-CM

## 2015-08-10 LAB — TOXASSURE SELECT,+ANTIDEPR,UR: PDF: 0

## 2015-08-13 ENCOUNTER — Telehealth: Payer: Self-pay

## 2015-08-13 NOTE — Telephone Encounter (Signed)
FYI-Pt's UDS came up positive for amphetamine, Oxazpem, Temazepam, Alprazolam and THC.

## 2015-08-14 ENCOUNTER — Ambulatory Visit (HOSPITAL_COMMUNITY)
Admission: RE | Admit: 2015-08-14 | Discharge: 2015-08-14 | Disposition: A | Discharge status: Home / Self Care | Payer: Medicaid Other | Source: Ambulatory Visit | Attending: Sports Medicine | Admitting: Sports Medicine

## 2015-08-14 DIAGNOSIS — M4802 Spinal stenosis, cervical region: Secondary | ICD-10-CM | POA: Diagnosis not present

## 2015-08-14 DIAGNOSIS — E041 Nontoxic single thyroid nodule: Secondary | ICD-10-CM | POA: Diagnosis not present

## 2015-08-14 DIAGNOSIS — M542 Cervicalgia: Secondary | ICD-10-CM | POA: Diagnosis present

## 2015-08-14 DIAGNOSIS — M50223 Other cervical disc displacement at C6-C7 level: Secondary | ICD-10-CM | POA: Insufficient documentation

## 2015-08-14 DIAGNOSIS — M2578 Osteophyte, vertebrae: Secondary | ICD-10-CM | POA: Insufficient documentation

## 2015-08-20 NOTE — Telephone Encounter (Signed)
Mr Richard Allison did have a prescription for diazepam #4 tablets 6/22 so that may explain the oxazepam and temazepam in UDS but xanax and amphetamine are not prescribed according to [facility]. See the entire message below

## 2015-08-20 NOTE — Telephone Encounter (Signed)
Patient had MRI and is wanting something for pain.  He wants to come in sooner than his August appointment so that he can get medication.  Please call patient.

## 2015-08-21 NOTE — Telephone Encounter (Signed)
This is Dr. Patel's patient 

## 2015-08-23 NOTE — Telephone Encounter (Signed)
Pt was started on Elavil, Cymbalta, and Tramadol.  In addition, he was referred to PT.  Further, he was just started on Baclofen per Ortho.  Considering his UDS, I believe this is an adequate addition of medications at present and he should alow some time for their effects.  If after a week, he is still not having benefit, we may add Robaxin 500 TID PRN. Thank you.

## 2015-08-24 NOTE — Telephone Encounter (Signed)
Left message to return call 

## 2015-08-25 NOTE — Telephone Encounter (Signed)
Patient returned Amber's phone call. 

## 2015-08-25 NOTE — Telephone Encounter (Signed)
Left message for pt to return call.

## 2015-08-27 MED ORDER — METHOCARBAMOL 500 MG PO TABS: 500.0000 mg | ORAL_TABLET | Freq: Two times a day (BID) | ORAL | 0 refills | Status: DC

## 2015-08-27 MED ORDER — NORTRIPTYLINE HCL 50 MG PO CAPS: 50.0000 mg | ORAL_CAPSULE | Freq: Every day | ORAL | 0 refills | Status: DC

## 2015-08-27 NOTE — Telephone Encounter (Signed)
He may d/c Elavil and we can prescribe him Nortriptyline 50mg  qHS.  If this is still making him too drowsy, he can reduce it to 25mg .  He can increase his Baclofen to 20mg  TID (Ortho is prescribing this, but we may take it over) and he can d/c his flexaril.  We can also add Robaxin 500mg  60 tablets for him to use after PT sessions.  Thanks.

## 2015-08-27 NOTE — Telephone Encounter (Signed)
Pt advised. Pamelor 50 mg and Robaxin 500 mg sent to [facility] in [place]. Pt declines the increase in Baclofen. He states it is not helping him during the day. He also states that he is still going to take his Flexeril. Pt is adamant about getting stronger pain meds.

## 2015-08-27 NOTE — Telephone Encounter (Signed)
Spoke with pt. He states that he is taking about 4 Tramadol's per day, Flexeril and Baclofen with no relief. I explained to the pt that he does need to participate in PT along with taking meds. He wanted to go to the [facility] location for outpatient PT due to living in [place], which I made changes to the current referral. He states that when he went to PT before, he was extremely sore afterwards. He would like to know what he can do to help with the pain after his PT visits when they get scheduled. The pain after PT is why he is reluctant to try it. Also, he states that he Elavil is "too much". 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 He has been seeing the chiropractor for his neck, which has been helping. Please advise.

## 2015-08-27 NOTE — Telephone Encounter (Signed)
Pt is requesting [place] to get in touch with his doctor in [place] for records regarding his back surgery. He is going to call that office to approve release of records and give [place] a call back tomorrow with the info we need to receive those records.

## 2015-08-27 NOTE — Telephone Encounter (Signed)
I had a long conversation with patient on last visit regarding pain medications, management, contract, and routine testing.  At present, I am not in a position to prescribe him narcotics based on his UDS.  Thanks.

## 2015-08-27 NOTE — Telephone Encounter (Signed)
Left message to return call 

## 2015-09-10 ENCOUNTER — Encounter: Payer: Medicaid Other | Attending: Physical Medicine & Rehabilitation | Admitting: Physical Medicine & Rehabilitation

## 2015-09-10 ENCOUNTER — Encounter: Payer: Self-pay | Admitting: Physical Medicine & Rehabilitation

## 2015-09-10 VITALS — BP 148/92 | HR 109 | Resp 16

## 2015-09-10 DIAGNOSIS — M4802 Spinal stenosis, cervical region: Secondary | ICD-10-CM | POA: Insufficient documentation

## 2015-09-10 DIAGNOSIS — M609 Myositis, unspecified: Secondary | ICD-10-CM

## 2015-09-10 DIAGNOSIS — M5126 Other intervertebral disc displacement, lumbar region: Secondary | ICD-10-CM | POA: Diagnosis not present

## 2015-09-10 DIAGNOSIS — M791 Myalgia: Secondary | ICD-10-CM | POA: Insufficient documentation

## 2015-09-10 DIAGNOSIS — G479 Sleep disorder, unspecified: Secondary | ICD-10-CM | POA: Diagnosis not present

## 2015-09-10 DIAGNOSIS — G894 Chronic pain syndrome: Secondary | ICD-10-CM | POA: Insufficient documentation

## 2015-09-10 DIAGNOSIS — IMO0001 Reserved for inherently not codable concepts without codable children: Secondary | ICD-10-CM

## 2015-09-10 DIAGNOSIS — F17210 Nicotine dependence, cigarettes, uncomplicated: Secondary | ICD-10-CM | POA: Diagnosis not present

## 2015-09-10 DIAGNOSIS — Z720 Tobacco use: Secondary | ICD-10-CM

## 2015-09-10 DIAGNOSIS — M549 Dorsalgia, unspecified: Secondary | ICD-10-CM | POA: Insufficient documentation

## 2015-09-10 DIAGNOSIS — G8929 Other chronic pain: Secondary | ICD-10-CM

## 2015-09-10 DIAGNOSIS — M542 Cervicalgia: Secondary | ICD-10-CM | POA: Diagnosis not present

## 2015-09-10 DIAGNOSIS — Z981 Arthrodesis status: Secondary | ICD-10-CM | POA: Diagnosis not present

## 2015-09-10 MED ORDER — DULOXETINE HCL 30 MG PO CPEP: 60.0000 mg | ORAL_CAPSULE | Freq: Every day | ORAL | 1 refills | Status: DC

## 2015-09-10 MED ORDER — TRAMADOL HCL 50 MG PO TABS: 100.0000 mg | ORAL_TABLET | Freq: Three times a day (TID) | ORAL | 0 refills | Status: DC | PRN

## 2015-09-10 NOTE — Progress Notes (Addendum)
Subjective:    Patient ID: Richard Allison, male    DOB: 11-11-1985, 30 y.o.   MRN: [id number]  HPI  30 y/o male with pmh of tobacco abuse, back pain s/p lumbar fusion presents neck > back pain.  Neck pain located on left side.  Back pain on right side.  Started in 2001 after heavy lifting.  Back surgery was in 2009. Neck is getting progressively worse since 2012.  Prolonged activity exacerbate the pain.  Chiropractor improves the pain. Sharp pain.  Pain radiates to elbow.  Before going to the chiropractor is travel down to his hand.  Constant.  Ice helps.  Flexaril helps pt go to sleep.  Baclofen didn't seen to help, but only took one.  Denies associated numbness, tingling, weakness.   This has resulted in poor sleep. It limits him from doing daily activities and working. It also limits his interactions with others. He notes a decrease in appetite.  Pt had C-spine MRI 06/2014 and L-spine 05/2015. Last clinic visit 07/31/15.  Today, pain is 8/10.  Since last visit he had a repeat MRI and was referred to Neurosurg. Pt states he has been using his friends pool.  He states it feels good to be in the pool.  He goes to PT tomorrow. Tramadol does not appear to provide much benefit.  He does not notice much difference.  He takes Baclofen occasionally.  Nortriptyline and Amytriptyaline cause sedation.  Robaxin helps a little.    Pain Inventory Average Pain 8 Pain Right Now 8 My pain is sharp, stabbing and aching  In the last 24 hours, has pain interfered with the following? General activity 8 Relation with others 8 Enjoyment of life 8 What TIME of day is your pain at its worst? morning, night Sleep (in general) Poor  Pain is worse with: bending, sitting, inactivity, standing and some activites Pain improves with: therapy/exercise and medication Relief from Meds: 0  Mobility walk without assistance how many minutes can you walk? 20-30 ability to climb steps?  yes do you drive?  yes Do you have  any goals in this area?  yes  Function I need assistance with the following:  household duties  Neuro/Psych No problems in this area  Prior Studies Any changes since last visit?  yes x-rays CT/MRI  Physicians involved in your care Primary care Dr. Collins [place]   Family History  Problem Relation Age of Onset  . Heart disease Father   . Diabetes Father   . Hyperlipidemia Father    Social History   Social History  . Marital status: Legally Separated    Spouse name: N/A  . Number of children: N/A  . Years of education: N/A   Social History Main Topics  . Smoking status: Current Every Day Smoker    Packs/day: 1.00    Types: Cigarettes  . Smokeless tobacco: Current User  . Alcohol use No  . Drug use: No  . Sexual activity: Yes    Birth control/ protection: None   Other Topics Concern  . None   Social History Narrative  . None   Past Surgical History:  Procedure Laterality Date  . BACK SURGERY     History reviewed. No pertinent past medical history. BP (!) 148/92   Pulse (!) 109   Resp 16   SpO2 98%   Opioid Risk Score:   Fall Risk Score:  `1  Depression screen PHQ 2/9  Depression screen [facility] 2/9 07/31/2015  Decreased Interest 2  Down, Depressed, Hopeless 2  PHQ - 2 Score 4  Altered sleeping 3  Tired, decreased energy 3  Change in appetite 2  Feeling bad or failure about yourself  3  Trouble concentrating 2  Moving slowly or fidgety/restless 2  Suicidal thoughts 0  PHQ-9 Score 19  Difficult doing work/chores Extremely dIfficult   Review of Systems  Constitutional: Positive for appetite change.  Neurological:       Spasms   All other systems reviewed and are negative.     Objective:   Physical Exam Gen: NAD. Vital signs reviewed HENT: Normocephalic, Atraumatic Eyes: EOMI, Conj WNL Cardio: S1, S2 normal, RRR Pulm: B/l clear to auscultation.  Effort normal Abd: Soft, non-distended, non-tender, BS+ MSK:  Gait WNL.   TTP over C-spine, PSP, and  right upper trap.    No edema.  Neuro: CN II-XII grossly intact.    Sensation intact to light touch in all UE dermatomes  Reflexes 1+ LUE, 2+RUE  Strength  5/5 in all UE myotomes  Neg Spurling's b/l, however, limitted pressure due to pain  Arm lift test positive Skin: Warm and Dry    Assessment & Plan:  30 y/o male with pmh of tobacco abuse, back pain s/p lumbar fusion presents neck > back pain.  1. Chronic cervical neck pain  MRI 08/2015, now showing Left-sided disc and osteophyte complex at C5-6 similar to the prior study. This is causing cord flattening and mild spinal stenosis with mild-to-moderate foraminal encroachment. No change from the prior MRI. Central and left-sided disc protrusion with left-sided spurring at C6-7 unchanged from the prior study. Chronic mild left foraminal narrowing unchanged. 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  Tramadol ineffective, but pt prefers it to nothing - educated on appropriate use per prescription  Elavil switched to Nortryptaline, but pt only occasionally taking due to sedation  Cont aquatic therapy.   Cont Ice  Refered to PT for ROM, strengthening, Iontophoresis, ultrasound, and evaluation for TENs (pt to go tomorrow)  UDS reviews on last visit, inconsistent  Cont Baclofen 10 TID (prescribed by Ortho)  Will increase Cymbalta to 60  Will inquire about ESI under sedation due to the pt's inability to tolerate  Pt not interested in trying Gralise right now due to the fact that he believes he is on too much nerve medication.   Pt with limitted options at this point due to inconsistent UDS and phobia of needles and wish to prolong surgery  NCTracks reviewed   2. Chronic Mechanic low back pain  MRI 05/2015: 1. Posterior lumbar interbody fusion from L4 through S1 without failure or complication. No foraminal or  central canal stenosis. 2. At L2-3, there is a small left paracentral disc protrusion.  See above  3. Tobacco abuse  Educated and encouraged on cessation  4. Sleep disturbance  Cont activity  Nortryptaline as needed  Pt looking into different mattress   5. 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

## 2015-09-10 NOTE — Addendum Note (Signed)
Addended by: Doreene ElandSHUMAKER, Johnni Wunschel W on: 09/10/2015 02:06 PM   Modules accepted: Orders

## 2015-09-10 NOTE — Addendum Note (Signed)
Addended by: Maryla MorrowPATEL, ANKIT A on: 09/10/2015 02:09 PM   Modules accepted: Orders

## 2015-09-11 ENCOUNTER — Ambulatory Visit (HOSPITAL_COMMUNITY): Payer: Medicaid Other | Admitting: Physical Therapy

## 2015-09-14 ENCOUNTER — Ambulatory Visit (HOSPITAL_COMMUNITY): Payer: Medicaid Other | Attending: Physical Medicine & Rehabilitation | Admitting: Physical Therapy

## 2015-09-14 DIAGNOSIS — M542 Cervicalgia: Secondary | ICD-10-CM

## 2015-09-14 DIAGNOSIS — R293 Abnormal posture: Secondary | ICD-10-CM | POA: Diagnosis not present

## 2015-09-14 NOTE — Therapy (Signed)
[facility] [address] [place], [place], [address] Phone: [phone]   Fax:  [phone]  Physical Therapy Evaluation  Patient Details  Name: Richard Allison MRN: [id number] Date of Birth: 09/24/1985 Referring Provider: Anthonette LegatorAnkit Patell  Encounter Date: 09/14/2015      PT End of Session - 09/14/15 1429    Visit Number 1   Number of Visits 1   Authorization Type medicaid   PT Start Time 1350   PT Stop Time 1430   PT Time Calculation (min) 40 min   Activity Tolerance Patient tolerated treatment well      No past medical history on file.  Past Surgical History:  Procedure Laterality Date  . BACK SURGERY      There were no vitals filed for this visit.       Subjective Assessment - 09/14/15 1353    Subjective Mr. [name] states that he has had cervical  and back pain since 2012 he states that his pain is greater on the left.  He states that the pain is constant    Pertinent History lumbar fusion, + tobacco use    Currently in Pain? Yes   Pain Score 6   highest 8-9; lowest 5-6   Pain Location Neck   Pain Orientation Left   Pain Descriptors / Indicators Aching   Pain Type Chronic pain            [facility] PT Assessment - 09/14/15 0001      Assessment   Medical Diagnosis cervical pain   Referring Provider DrAnkit Patell   Onset Date/Surgical Date 07/02/10   Next MD Visit unknown   Prior Therapy not for this issue      Precautions   Precautions None     Restrictions   Weight Bearing Restrictions No     Balance Screen   Has the patient fallen in the past 6 months No   Has the patient had a decrease in activity level because of a fear of falling?  No   Is the patient reluctant to leave their home because of a fear of falling?  No     Prior Function   Level of Independence Independent   Vocation Full time employment   ChiropodistVocation Requirements electrician     Cognition   Overall Cognitive Status Within Functional  Limits for tasks assessed     ROM / Strength   AROM / PROM / Strength AROM;Strength     AROM   AROM Assessment Site Cervical   Cervical Flexion WNL   Cervical Extension WNL   Cervical - Right Side Bend WNL with reps causing pain   Cervical - Left Side Bend WNL with reps causing increased pain    Cervical - Right Rotation WNL   Cervical - Left Rotation WNL      Strength   Strength Assessment Site Cervical   Cervical Extension 4/5   Cervical - Right Side Bend 5/5   Cervical - Left Side Bend 5/5           Cervical extension x 5 Isometric extension: cervical x 5 Cervical retraction x 5 Scapular retraction x 5 Postural thera band exercises x 5                PT Education - 09/14/15 1428    Education provided Yes   Education Details The importance of correct posture, HEP   Person(s) Educated Patient   Methods Explanation   Comprehension Verbalized  understanding          PT Short Term Goals - 09/14/15 1432      PT SHORT TERM GOAL #1   Title Pt to be independent in a HEP to decrease his symptoms of pain to no greater than 4/10 on a daily basis   Time 3   Period Weeks   Status New                  Plan - 09/14/15 1430    Clinical Impression Statement PT is a 30 yo male who has had chronic lumbar and cervical pain.  He had a fusion in his lumbar spine several years ago.  At this time he is being referred to skilled PT for his cervical pain.  Evaluation demonstrates postural deficits, decreased strength and increased pain.  His insurance only allows for one visit therefore Mr. [name] was given a HEP    Rehab Potential Fair   PT Frequency 1x / week   PT Duration --  1 week   PT Treatment/Interventions Therapeutic exercise   PT Next Visit Plan one visit only per insurance       Patient will benefit from skilled therapeutic intervention in order to improve the following deficits and impairments:  Decreased activity tolerance, Decreased  strength, Pain, Postural dysfunction  Visit Diagnosis: Abnormal posture - Plan: PT plan of care cert/re-cert  Cervicalgia - Plan: PT plan of care cert/re-cert     Problem List Patient Active Problem List   Diagnosis Date Noted  . Chronic neck pain 07/31/2015  . Chronic back pain 07/31/2015  . Sleep disturbance 07/31/2015  . Myalgia and myositis 07/31/2015  . Tobacco abuse 07/31/2015  . Tobacco abuse counseling 07/31/2015    Virgina Organynthia Russell, PT CLT [phone] 09/14/2015, 2:38 PM  [facility] [address] [place], [place], [address] Phone: [phone]   Fax:  [phone]  Name: Richard Gumicholas W Salih MRN: [id number] Date of Birth: Jun 30, 1985

## 2015-09-14 NOTE — Patient Instructions (Addendum)
AROM: Neck Extension    Bend head backward. Hold __2__ seconds. Repeat 5-__10__ times per set. Do _1___ sets per session. Do _2___ sessions per day.  http://orth.exer.us/300   Copyright  VHI. All rights reserved.  Strengthening: Extension - Isometric (in Neutral)    Using light pressure from fingertips at back of head, resist bending head backward. Hold __3__ seconds. Repeat 10____ times per set. Do 1____ sets per session. Do ____ sessions per day. 2 http://orth.exer.us/308   Copyright  VHI. All rights reserved.  Flexibility: Neck Retraction    Pull head straight back, keeping eyes and jaw level. Repeat __10__ times per set. Do _1___ sets per session. Do ___2_ sessions per day.  http://orth.exer.us/344   Copyright  VHI. All rights reserved.  Scapular Retraction (Standing)    With arms at sides, pinch shoulder blades together. Repeat _10___ times per set. Do __1__ sets per session. Do ___2_ sessions per day. 1 http://orth.exer.us/944   Copyright  VHI. All rights reserved.  Lower Cervical / Upper Thoracic Stretch    Clasp hands together in front with arms extended. Gently pull shoulder blades apart and bend head forward. Hold ___5-10_ seconds. Repeat __3__ times per set. Do ____1 sets per session. Do ___2_ sessions per day.  http://orth.exer.us/354   Copyright  VHI. All rights reserved.

## 2015-10-22 ENCOUNTER — Encounter: Payer: Medicaid Other | Attending: Physical Medicine & Rehabilitation | Admitting: Physical Medicine & Rehabilitation

## 2015-10-22 ENCOUNTER — Encounter: Payer: Self-pay | Admitting: Physical Medicine & Rehabilitation

## 2015-10-22 ENCOUNTER — Telehealth: Payer: Self-pay | Admitting: *Deleted

## 2015-10-22 VITALS — BP 138/87 | HR 107 | Resp 16

## 2015-10-22 DIAGNOSIS — G479 Sleep disorder, unspecified: Secondary | ICD-10-CM

## 2015-10-22 DIAGNOSIS — M5126 Other intervertebral disc displacement, lumbar region: Secondary | ICD-10-CM | POA: Diagnosis not present

## 2015-10-22 DIAGNOSIS — IMO0001 Reserved for inherently not codable concepts without codable children: Secondary | ICD-10-CM

## 2015-10-22 DIAGNOSIS — Z720 Tobacco use: Secondary | ICD-10-CM

## 2015-10-22 DIAGNOSIS — F17210 Nicotine dependence, cigarettes, uncomplicated: Secondary | ICD-10-CM | POA: Diagnosis not present

## 2015-10-22 DIAGNOSIS — M542 Cervicalgia: Secondary | ICD-10-CM | POA: Diagnosis not present

## 2015-10-22 DIAGNOSIS — M549 Dorsalgia, unspecified: Secondary | ICD-10-CM | POA: Diagnosis present

## 2015-10-22 DIAGNOSIS — Z981 Arthrodesis status: Secondary | ICD-10-CM | POA: Diagnosis not present

## 2015-10-22 DIAGNOSIS — M4802 Spinal stenosis, cervical region: Secondary | ICD-10-CM | POA: Insufficient documentation

## 2015-10-22 DIAGNOSIS — G8929 Other chronic pain: Secondary | ICD-10-CM

## 2015-10-22 DIAGNOSIS — M791 Myalgia: Secondary | ICD-10-CM | POA: Diagnosis not present

## 2015-10-22 DIAGNOSIS — Z5181 Encounter for therapeutic drug level monitoring: Secondary | ICD-10-CM

## 2015-10-22 DIAGNOSIS — G894 Chronic pain syndrome: Secondary | ICD-10-CM | POA: Diagnosis not present

## 2015-10-22 MED ORDER — TRAMADOL HCL 50 MG PO TABS: 100.0000 mg | ORAL_TABLET | Freq: Four times a day (QID) | ORAL | 0 refills | Status: DC | PRN

## 2015-10-22 MED ORDER — METHOCARBAMOL 500 MG PO TABS: 500.0000 mg | ORAL_TABLET | Freq: Three times a day (TID) | ORAL | 1 refills | Status: DC | PRN

## 2015-10-22 MED ORDER — CYCLOBENZAPRINE HCL 10 MG PO TABS: 10.0000 mg | ORAL_TABLET | Freq: Every evening | ORAL | 1 refills | Status: DC | PRN

## 2015-10-22 MED ORDER — DICLOFENAC POTASSIUM 50 MG PO TABS: 50.0000 mg | ORAL_TABLET | Freq: Three times a day (TID) | ORAL | 1 refills | Status: DC

## 2015-10-22 NOTE — Telephone Encounter (Signed)
He may resume his Ibuprofen as needed (he was taking this previously).  Thanks.

## 2015-10-22 NOTE — Addendum Note (Signed)
Addended by: Kathrine HaddockWOOD, AMBER L on: 10/22/2015 02:51 PM   Modules accepted: Orders

## 2015-10-22 NOTE — Progress Notes (Signed)
Subjective:    Patient ID: Richard Allison, male    DOB: 08-17-1985, 30 y.o.   MRN: [id number]  HPI  30 y/o male with pmh of tobacco abuse, back pain s/p lumbar fusion presents neck > back pain.  Neck pain located on left side.  Back pain on right side.  Started in 2001 after heavy lifting.  Back surgery was in 2009. Neck is getting progressively worse since 2012.  Prolonged activity exacerbate the pain.  Chiropractor improves the pain. Sharp pain.  Pain radiates to elbow.  Before going to the chiropractor is travel down to his hand.  Constant.  Ice helps.  Flexaril helps pt go to sleep.  Denies associated numbness, tingling, weakness.  This has resulted in poor sleep. It limits him from doing daily activities and working. It also limits his interactions with others. Pt had C-spine MRI 06/2014 and L-spine 05/2015. He has had a repeat MRI and was referred to Neurosurg.   Last clinic visit 09/10/15.  He continues to take tramadol, but states it is ineffective.  He takes Nortryptaline, which he takes at times, but it makes him sleepy.  He continues to use his friends pool.  He went to PT, but he was only allowed 1 visit.  He continues Baclofen.  He states the Cymbalta causes a decrease in energy.  TENS unit help.   [facility] due to phobia of needles, but he was declined.  Pt going to get a new mattress soon. He states the flexaril, robaxin, and tramadol work the best.    Pain Inventory Average Pain 8 Pain Right Now 8 My pain is sharp, stabbing and aching  In the last 24 hours, has pain interfered with the following? General activity 8 Relation with others 8 Enjoyment of life 8 What TIME of day is your pain at its worst? morning, night Sleep (in general) Poor  Pain is worse with: bending, sitting, inactivity, standing and some activites Pain improves with: therapy/exercise and medication Relief from Meds: 0  Mobility walk without assistance how many minutes can you  walk? 20-30 ability to climb steps?  yes do you drive?  yes Do you have any goals in this area?  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  yes x-rays CT/MRI  Physicians involved in your care Primary care Dr. Collins [place]   Family History  Problem Relation Age of Onset  . Heart disease Father   . Diabetes Father   . Hyperlipidemia Father    Social History   Social History  . Marital status: Legally Separated    Spouse name: N/A  . Number of children: N/A  . Years of education: N/A   Social History Main Topics  . Smoking status: Current Every Day Smoker    Packs/day: 1.00    Types: Cigarettes  . Smokeless tobacco: Current User  . Alcohol use No  . Drug use: No  . Sexual activity: Yes    Birth control/ protection: None   Other Topics Concern  . None   Social History Narrative  . None   Past Surgical History:  Procedure Laterality Date  . BACK SURGERY     No past medical history on file. BP 138/87   Pulse (!) 107   Resp 16   SpO2 99%  Opioid Risk Score:   Fall Risk Score:  `1  Depression screen PHQ 2/9  Depression screen [facility] 2/9 10/22/2015 07/31/2015  Decreased Interest 2 2  Down, Depressed, Hopeless 2 2  PHQ - 2 Score 4 4  Altered sleeping - 3  Tired, decreased energy - 3  Change in appetite - 2  Feeling bad or failure about yourself  - 3  Trouble concentrating - 2  Moving slowly or fidgety/restless - 2  Suicidal thoughts - 0  PHQ-9 Score - 19  Difficult doing work/chores - Extremely dIfficult   Review of Systems  Constitutional: Positive for appetite change.  Musculoskeletal: Positive for back pain, myalgias and neck pain.  Neurological:       Spasms   All other systems reviewed and are negative.     Objective:   Physical Exam Gen: NAD. Vital signs  reviewed HENT: Normocephalic, Atraumatic Eyes: EOMI, Conj WNL Cardio: S1, S2 normal, RRR Pulm: B/l clear to auscultation.  Effort normal Abd: Soft, non-distended, non-tender, BS+ MSK:  Gait WNL.   TTP over C-spine, PSP, and right upper trap.    No edema.  Neuro: CN II-XII grossly intact.    Sensation intact to light touch in all UE dermatomes  Reflexes 1+ LUE, 2+RUE  Strength  5/5 in all UE myotomes  Neg Spurling's b/l, however, limitted pressure due to pain  Arm lift test positive Skin: Warm and Dry    Assessment & Plan:  30 y/o male with pmh of tobacco abuse, back pain s/p lumbar fusion presents neck > back pain.  1. Chronic cervical neck pain  MRI 08/2015, showing Left-sided disc and osteophyte complex at C5-6 similar to the prior study. This is causing cord flattening and mild spinal stenosis with mild-to-moderate foraminal encroachment. No change from the prior MRI. Central and left-sided disc protrusion with left-sided spurring at C6-7 unchanged from the prior study. Chronic mild left foraminal narrowing unchanged. 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  Cymbalta 60 makes pt too fatigued  Pt went to PT for 1 visit  Pt not interested in trying Gralise right now due to the fact that he believes he is on too much nerve medication.   Cont aquatic therapy.   Cont Ice  TENs effective  Elavil switched to Nortryptaline, but pt only occasionally taking due to sedation.    Cont Baclofen 10 TID PRN  Cont Robaxin 500 TID  Cont Flexaril qHS  Tramadol works most of the time, refilled, 100 4/day PRN    UDS reviews on last visit, inconsistent, will order repeat  Pt with limitted options at this point due to inconsistent UDS and phobia of needles and wish to prolong surgery  Referral for ESI under sedation denies, will follow up for reason   Will order  diclofenac 50 TID   2. Chronic Mechanic low back pain  MRI 05/2015: 1. Posterior lumbar interbody fusion from L4 through S1 without failure or complication. No foraminal or central canal stenosis. 2. At L2-3, there is a small left paracentral disc protrusion.  See above  3. Tobacco abuse  Educated and encouraged on cessation  4. Sleep disturbance  Cont activity  Nortryptaline as needed  Pt to get new mattress soon   5. Myalgia  Cont Baclofen 10 TID (prescribed by Ortho)  Pt unable to tolerate injections due to severe phobia,  will readdress in future

## 2015-10-22 NOTE — Telephone Encounter (Signed)
Insurance [facility](medicaid) will not cover diclofenac.   Preferred are : ibuprofen suspension / tablet (generic for Motrin)   indomethacin capsule (generic for Indocin)   ketorolac tablet (generic for Toradol)   meloxicam tablet (generic for Mobic Tablet)   naproxen EC tablet (generic for Naprosyn EC)   naproxen tablet (generic for Naprosyn Tablet)   sulindac tablet (generic for Clinoril)

## 2015-10-27 MED ORDER — IBUPROFEN 800 MG PO TABS: 800.0000 mg | ORAL_TABLET | Freq: Three times a day (TID) | ORAL | 1 refills | Status: DC

## 2015-10-29 LAB — TOXASSURE SELECT,+ANTIDEPR,UR

## 2015-11-02 NOTE — Progress Notes (Signed)
Urine drug screen for this encounter is consistent for prescribed medication 

## 2015-11-04 ENCOUNTER — Telehealth: Payer: Self-pay | Admitting: Physical Medicine & Rehabilitation

## 2015-11-04 NOTE — Telephone Encounter (Signed)
Patient is calling to see if Urine Drug Screen is back and if it is, would like to get something stronger for pain.  Please call patient at [phone].

## 2015-11-04 NOTE — Telephone Encounter (Signed)
Urine drug screen was consistent. Dr [name] will not be back in office until 11/11/15. Will forward message to him.

## 2015-11-09 NOTE — Telephone Encounter (Signed)
We can prescribe him Oxycodone 5mg  TID PRN in addition to what he is taking until his next visit with me.  Thanks.

## 2015-11-11 MED ORDER — OXYCODONE HCL 5 MG PO TABS: 5.0000 mg | ORAL_TABLET | Freq: Three times a day (TID) | ORAL | 0 refills | Status: DC | PRN

## 2015-11-11 NOTE — Addendum Note (Signed)
Addended by: Doreene ElandSHUMAKER, Shine Mikes W on: 11/11/2015 09:38 AM   Modules accepted: Orders

## 2015-11-11 NOTE — Telephone Encounter (Addendum)
I wil write RX for 7 days because he has appt with [name] 11/19/15.  Mr Basque notified and asked to bring bottle to his next appt after filling at pharmacy.

## 2015-11-19 ENCOUNTER — Encounter: Payer: Medicaid Other | Attending: Physical Medicine & Rehabilitation | Admitting: Registered Nurse

## 2015-11-19 ENCOUNTER — Encounter: Payer: Self-pay | Admitting: Registered Nurse

## 2015-11-19 VITALS — BP 124/86 | HR 94 | Resp 14

## 2015-11-19 DIAGNOSIS — M549 Dorsalgia, unspecified: Secondary | ICD-10-CM | POA: Insufficient documentation

## 2015-11-19 DIAGNOSIS — M542 Cervicalgia: Secondary | ICD-10-CM | POA: Diagnosis not present

## 2015-11-19 DIAGNOSIS — Z79899 Other long term (current) drug therapy: Secondary | ICD-10-CM

## 2015-11-19 DIAGNOSIS — M545 Low back pain, unspecified: Secondary | ICD-10-CM

## 2015-11-19 DIAGNOSIS — F17210 Nicotine dependence, cigarettes, uncomplicated: Secondary | ICD-10-CM | POA: Insufficient documentation

## 2015-11-19 DIAGNOSIS — G894 Chronic pain syndrome: Secondary | ICD-10-CM | POA: Diagnosis not present

## 2015-11-19 DIAGNOSIS — M791 Myalgia: Secondary | ICD-10-CM | POA: Diagnosis not present

## 2015-11-19 DIAGNOSIS — Z5181 Encounter for therapeutic drug level monitoring: Secondary | ICD-10-CM

## 2015-11-19 DIAGNOSIS — M5126 Other intervertebral disc displacement, lumbar region: Secondary | ICD-10-CM | POA: Insufficient documentation

## 2015-11-19 DIAGNOSIS — G479 Sleep disorder, unspecified: Secondary | ICD-10-CM | POA: Diagnosis not present

## 2015-11-19 DIAGNOSIS — G8929 Other chronic pain: Secondary | ICD-10-CM

## 2015-11-19 DIAGNOSIS — Z720 Tobacco use: Secondary | ICD-10-CM

## 2015-11-19 DIAGNOSIS — Z981 Arthrodesis status: Secondary | ICD-10-CM | POA: Insufficient documentation

## 2015-11-19 DIAGNOSIS — M4802 Spinal stenosis, cervical region: Secondary | ICD-10-CM | POA: Insufficient documentation

## 2015-11-19 MED ORDER — OXYCODONE HCL 5 MG PO TABS: 5.0000 mg | ORAL_TABLET | Freq: Three times a day (TID) | ORAL | 0 refills | Status: DC | PRN

## 2015-11-19 MED ORDER — TRAMADOL HCL 50 MG PO TABS: 100.0000 mg | ORAL_TABLET | Freq: Four times a day (QID) | ORAL | 0 refills | Status: DC | PRN

## 2015-11-19 NOTE — Progress Notes (Signed)
Subjective:    Patient ID: Richard Allison, male    DOB: [birth year], 30 y.o.   MRN: [id number]  HPI: [name] is a 30 year old male who returns for follow up for chronic pain and medication refill. He states his pain is located in his neck and lower back. Also states the pain is worse in the morning the new regimen of oxycodone and tramadol has decreased his pain. Also has noticed the oxycodone only last for fours.  He rates his pain 8. His current exercise regime is walking and performing stretching exercises.   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   Pain Inventory Average Pain 8 Pain Right Now 8 My pain is constant, sharp, dull, stabbing and aching  In the last 24 hours, has pain interfered with the following? General activity 7 Relation with others 7 Enjoyment of life 7 What TIME of day is your pain at its worst? Morning, night Sleep (in general) Poor  Pain is worse with: bending, sitting, inactivity and some activites Pain improves with: rest, heat/ice and medication Relief from Meds: 6  Mobility walk without assistance how many minutes can you walk? 30 ability to climb steps?  yes do you drive?  yes Do you have any goals in this area?  yes  Function employed # of hrs/week 30-40 hrs/week  Neuro/Psych No problems in this area  Prior Studies Any changes since last visit?  no  Physicians involved in your care Any changes since last visit?  no   Family History  Problem Relation Age of Onset  . Heart disease Father   . Diabetes Father   . Hyperlipidemia Father    Social History   Social History  . Marital status: Legally Separated    Spouse name: N/A  . Number of children: N/A  . Years of education: N/A   Social History Main Topics  . Smoking status: Current Every Day Smoker    Packs/day: 1.00    Types: Cigarettes  . Smokeless tobacco: Current User  . Alcohol use No  . Drug use: No  . Sexual  activity: Yes    Birth control/ protection: None   Other Topics Concern  . None   Social History Narrative  . None   Past Surgical History:  Procedure Laterality Date  . BACK SURGERY     No past medical history on file. BP 124/86   Pulse 94   Resp 14   SpO2 98%   Opioid Risk Score:   Fall Risk Score:  `1  Depression screen PHQ 2/9  Depression screen [facility] 2/9 10/22/2015 07/31/2015  Decreased Interest 2 2  Down, Depressed, Hopeless 2 2  PHQ - 2 Score 4 4  Altered sleeping - 3  Tired, decreased energy - 3  Change in appetite - 2  Feeling bad or failure about yourself  - 3  Trouble concentrating - 2  Moving slowly or fidgety/restless - 2  Suicidal thoughts - 0  PHQ-9 Score - 19  Difficult doing work/chores - Extremely dIfficult     Review of Systems  Gastrointestinal: Positive for diarrhea.  All other systems reviewed and are negative.      Objective:   Physical Exam  Constitutional: He is oriented to person, place, and time. He appears well-developed and well-nourished.  HENT:  Head: Normocephalic and atraumatic.  Neck: Normal range of motion. Neck supple.  Cervical Paraspinal Tenderness: C-4- C-5  Cardiovascular: Normal  rate and regular rhythm.   Pulmonary/Chest: Effort normal and breath sounds normal.  Musculoskeletal:  Normal Muscle Bulk and Muscle Testing Reveals: Upper Extremities: Full ROM and Muscle Strength 5/5 Thoracic Paraspinal Tenderness: T-1- T-3 Mainly Left Side Lumbar Paraspinal Tenderness: L-4- L-5 Mainly Left Side  Lower Extremities: Full ROM and Muscle Strength 5/5 Arises from Table with ease Narrow based Gait  Neurological: He is alert and oriented to person, place, and time.  Skin: Skin is warm and dry.  Psychiatric: He has a normal mood and affect.  Nursing note and vitals reviewed.         Assessment & Plan:  1. Chronic cervical neck pain:    MRI 08/2015, showing Left-sided disc and osteophyte complex at C5-6: Continue with  Heat/Ice Therapy and HEP.  Refilled: Tramadol 50 mg tablets take 2 tablets (100mg ) every 6 hours as needed for sever pain and Oxycodone 5 mg one tablet every 8 hours  as needed for pain #90.  We will continue the opioid monitoring program, this consists of regular clinic visits, examinations, urine drug screen, pill counts as well as use of [place] Cont[place] Sub[place].                        2. Chronic Mechanic low back pain: MRI 05/2015: 1. Posterior lumbar interbody fusion from L4 through S1 without failure or complication. No foraminal or central canal stenosis. 2. At L2-3, there is a small left paracentral disc protrusion. Continue with HEP, continue to monitor. 3. Sleep disturbance: Continue to Monitor 4. Muscle Spasm: Continue Baclofen.   20 minutes of face to face patient care time was spent during this visit. All questions were encouraged and answered.   F/U in 1 month

## 2015-11-20 ENCOUNTER — Ambulatory Visit: Payer: Medicaid Other | Admitting: Physical Medicine & Rehabilitation

## 2015-12-16 ENCOUNTER — Encounter: Payer: Medicaid Other | Attending: Physical Medicine & Rehabilitation | Admitting: Registered Nurse

## 2015-12-16 ENCOUNTER — Telehealth: Payer: Self-pay | Admitting: *Deleted

## 2015-12-16 ENCOUNTER — Encounter: Payer: Self-pay | Admitting: Registered Nurse

## 2015-12-16 ENCOUNTER — Ambulatory Visit: Payer: Medicaid Other | Admitting: Physical Medicine & Rehabilitation

## 2015-12-16 VITALS — BP 150/90 | HR 108 | Resp 14

## 2015-12-16 DIAGNOSIS — Z981 Arthrodesis status: Secondary | ICD-10-CM | POA: Insufficient documentation

## 2015-12-16 DIAGNOSIS — M542 Cervicalgia: Secondary | ICD-10-CM | POA: Diagnosis not present

## 2015-12-16 DIAGNOSIS — G8929 Other chronic pain: Secondary | ICD-10-CM

## 2015-12-16 DIAGNOSIS — M4802 Spinal stenosis, cervical region: Secondary | ICD-10-CM | POA: Diagnosis not present

## 2015-12-16 DIAGNOSIS — Z720 Tobacco use: Secondary | ICD-10-CM

## 2015-12-16 DIAGNOSIS — F17210 Nicotine dependence, cigarettes, uncomplicated: Secondary | ICD-10-CM | POA: Insufficient documentation

## 2015-12-16 DIAGNOSIS — M791 Myalgia: Secondary | ICD-10-CM | POA: Insufficient documentation

## 2015-12-16 DIAGNOSIS — Z5181 Encounter for therapeutic drug level monitoring: Secondary | ICD-10-CM | POA: Diagnosis not present

## 2015-12-16 DIAGNOSIS — M549 Dorsalgia, unspecified: Secondary | ICD-10-CM | POA: Insufficient documentation

## 2015-12-16 DIAGNOSIS — G479 Sleep disorder, unspecified: Secondary | ICD-10-CM | POA: Insufficient documentation

## 2015-12-16 DIAGNOSIS — Z79899 Other long term (current) drug therapy: Secondary | ICD-10-CM

## 2015-12-16 DIAGNOSIS — G894 Chronic pain syndrome: Secondary | ICD-10-CM

## 2015-12-16 DIAGNOSIS — M5126 Other intervertebral disc displacement, lumbar region: Secondary | ICD-10-CM | POA: Diagnosis not present

## 2015-12-16 MED ORDER — TRAMADOL HCL 50 MG PO TABS: 100.0000 mg | ORAL_TABLET | Freq: Four times a day (QID) | ORAL | 0 refills | Status: DC | PRN

## 2015-12-16 MED ORDER — OXYCODONE HCL 5 MG PO TABS: 5.0000 mg | ORAL_TABLET | Freq: Three times a day (TID) | ORAL | 0 refills | Status: DC | PRN

## 2015-12-16 MED ORDER — CYCLOBENZAPRINE HCL 10 MG PO TABS: 10.0000 mg | ORAL_TABLET | Freq: Every evening | ORAL | 1 refills | Status: DC | PRN

## 2015-12-16 NOTE — Telephone Encounter (Signed)
Prior Authorization submitted for oxycodone 5mg  to [employer] [facility]

## 2015-12-16 NOTE — Progress Notes (Signed)
Subjective:    Patient ID: Richard Allison, male    DOB: 1985-11-27, 30 y.o.   MRN: [id number]  HPI: [name] is a 30 year old male who returns for follow up for chronic pain and medication refill. He states his pain is located in his neck and lower back.  He rates his pain 9. His current exercise regime is walking and performing stretching exercises.  Mr. [name] was out of his medication early, educated on the narcotic Policy. He admits he took more medication than prescribed, education given. No early re-fill will be given he verbalizes understanding. Warning letter given if this occurs again can lead to discharge, he verbalizes understanding.  Discuss the above with Dr. [name] and he agrees with plan.   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   Pain Inventory Average Pain 9 Pain Right Now 9 My pain is sharp, burning, dull, stabbing and aching  In the last 24 hours, has pain interfered with the following? General activity 8 Relation with others 8 Enjoyment of life 8 What TIME of day is your pain at its worst? morning evening and night Sleep (in general) NA  Pain is worse with: walking, bending, sitting, standing and some activites Pain improves with: therapy/exercise and medication Relief from Meds: 6  Mobility how many minutes can you walk? 30 ability to climb steps?  yes do you drive?  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  no  Physicians involved in your care Any changes since last visit?  no   Family History  Problem Relation Age of Onset  . Heart disease Father   . Diabetes Father   . Hyperlipidemia Father    Social History   Social History  . Marital status: Legally Separated    Spouse name: N/A  . Number of children: N/A  . Years  of education: N/A   Social History Main Topics  . Smoking status: Current Every Day Smoker    Packs/day: 1.00    Types: Cigarettes  . Smokeless tobacco: Current User  . Alcohol use No  . Drug use: No  . Sexual activity: Yes    Birth control/ protection: None   Other Topics Concern  . None   Social History Narrative  . None   Past Surgical History:  Procedure Laterality Date  . BACK SURGERY     History reviewed. No pertinent past medical history. BP (!) 150/90   Pulse (!) 108   Resp 14   SpO2 99%   Opioid Risk Score:   Fall Risk Score:  `1  Depression screen PHQ 2/9  [facility] 2/9 12/16/2015 10/22/2015 07/31/2015  Decreased Interest 2 2 2   Down, Depressed, Hopeless 2 2 2   PHQ - 2 Score 4 4 4   Altered sleeping - - 3  Tired, decreased energy - - 3  Change in appetite - - 2  Feeling bad or failure about yourself  - - 3  Trouble concentrating - - 2  Moving slowly or fidgety/restless - - 2  Suicidal thoughts - - 0  PHQ-9 Score - - 19  Difficult doing work/chores - - Extremely dIfficult    Review of Systems  Constitutional: Negative.   HENT: Negative.   Eyes: Negative.   Respiratory: Negative.  Cardiovascular: Negative.   Gastrointestinal: Negative.   Endocrine: Negative.   Genitourinary: Negative.   Musculoskeletal: Negative.   Skin: Negative.   Allergic/Immunologic: Negative.   Neurological: Negative.   Hematological: Negative.   Psychiatric/Behavioral: Negative.   All other systems reviewed and are negative.      Objective:   Physical Exam  Constitutional: He is oriented to person, place, and time. He appears well-developed and well-nourished.  HENT:  Head: Normocephalic and atraumatic.  Neck: Normal range of motion. Neck supple.  Cervical Paraspinal Tenderness: C-5- C-6  Cardiovascular: Normal rate and regular rhythm.   Pulmonary/Chest: Effort normal and breath sounds normal.  Musculoskeletal:  Normal Muscle Bulk and Muscle Testing  Reveals: Upper Extremities: Full ROM and Muscle Strength 5/5 Lumbar Paraspinal Tenderness: L-3-L-5 Lower Extremities: Full ROM and Muscle Strength 5/5 Arises from chair with ease Narrow Based Gait   Neurological: He is alert and oriented to person, place, and time.  Skin: Skin is warm and dry.  Psychiatric: He has a normal mood and affect.  Nursing note and vitals reviewed.         Assessment & Plan:  1. Chronic cervical neck pain:  MRI 08/2015, showing Left-sided disc and osteophyte complex at C5-6: Continue with Heat/Ice Therapy and HEP.  Refilled: Tramadol 50 mg tablets take 2 tablets (100mg ) every 6 hours as needed for sever pain and Oxycodone 5 mg one tablet every 8 hours  as needed for pain #90.  We will continue the opioid monitoring program, this consists of regular clinic visits, examinations, urine drug screen, pill counts as well as use of [place] Controlled Substance Reporting System. 2. Chronic Mechanic low back pain: MRI 05/2015: 1. Posterior lumbar interbody fusion from L4 through S1 without failure or complication. No foraminal or central canal stenosis.  At L2-3, there is a small left paracentral disc protrusion. Continue with HEP, continue to monitor. Continue current medication regime 3. Sleep disturbance: Continue to Monitor 4. Muscle Spasm: Continue Baclofen and Flexeril  20 minutes of face to face patient care time was spent during this visit. All questions were encouraged and answered.   F/U in 1 month

## 2015-12-19 ENCOUNTER — Encounter (HOSPITAL_COMMUNITY): Payer: Self-pay

## 2015-12-19 ENCOUNTER — Emergency Department (HOSPITAL_COMMUNITY): Payer: Medicaid Other

## 2015-12-19 ENCOUNTER — Emergency Department (HOSPITAL_COMMUNITY)
Admission: EM | Admit: 2015-12-19 | Discharge: 2015-12-19 | Disposition: A | Discharge status: Home / Self Care | Payer: Medicaid Other | Attending: Emergency Medicine | Admitting: Emergency Medicine

## 2015-12-19 DIAGNOSIS — Y999 Unspecified external cause status: Secondary | ICD-10-CM | POA: Insufficient documentation

## 2015-12-19 DIAGNOSIS — R0789 Other chest pain: Secondary | ICD-10-CM | POA: Insufficient documentation

## 2015-12-19 DIAGNOSIS — Y92410 Unspecified street and highway as the place of occurrence of the external cause: Secondary | ICD-10-CM | POA: Diagnosis not present

## 2015-12-19 DIAGNOSIS — F17210 Nicotine dependence, cigarettes, uncomplicated: Secondary | ICD-10-CM | POA: Diagnosis not present

## 2015-12-19 DIAGNOSIS — Y939 Activity, unspecified: Secondary | ICD-10-CM | POA: Insufficient documentation

## 2015-12-19 DIAGNOSIS — S161XXA Strain of muscle, fascia and tendon at neck level, initial encounter: Secondary | ICD-10-CM | POA: Insufficient documentation

## 2015-12-19 DIAGNOSIS — S199XXA Unspecified injury of neck, initial encounter: Secondary | ICD-10-CM | POA: Diagnosis present

## 2015-12-19 MED ORDER — MORPHINE SULFATE (PF) 4 MG/ML IV SOLN
4.0000 mg | Freq: Once | INTRAVENOUS | Status: AC
Start: 2015-12-19 — End: 2015-12-19
  Administered 2015-12-19: 4 mg via INTRAVENOUS
  Filled 2015-12-19: qty 1

## 2015-12-19 MED ORDER — FENTANYL CITRATE (PF) 100 MCG/2ML IJ SOLN
50.0000 ug | Freq: Once | INTRAMUSCULAR | Status: AC
  Administered 2015-12-19: 50 ug via INTRAVENOUS
  Filled 2015-12-19: qty 2

## 2015-12-19 MED ORDER — OXYCODONE HCL 5 MG PO TABS: 5.0000 mg | ORAL_TABLET | ORAL | 0 refills | Status: DC | PRN
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
## 2015-12-19 NOTE — ED Notes (Signed)
Pt. Just returned from [place]

## 2015-12-19 NOTE — Discharge Instructions (Signed)

## 2015-12-21 ENCOUNTER — Telehealth: Payer: Self-pay
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
## 2015-12-21 NOTE — Telephone Encounter (Signed)
He should be evaluated.  He can schedule an appointment to come see [name] or I.  He should be notified that he would have to return every 5 days for medications in addition to his chronic pain meds.  Thanks.

## 2015-12-22 ENCOUNTER — Other Ambulatory Visit: Payer: Self-pay

## 2015-12-22 ENCOUNTER — Telehealth: Payer: Self-pay

## 2015-12-22 NOTE — Telephone Encounter (Signed)
No answer

## 2015-12-23 ENCOUNTER — Encounter: Payer: Self-pay | Admitting: Registered Nurse

## 2015-12-23 ENCOUNTER — Encounter (HOSPITAL_BASED_OUTPATIENT_CLINIC_OR_DEPARTMENT_OTHER): Payer: Medicaid Other | Admitting: Registered Nurse

## 2015-12-23 VITALS — BP 142/97 | HR 102

## 2015-12-23 DIAGNOSIS — M545 Low back pain, unspecified: Secondary | ICD-10-CM

## 2015-12-23 DIAGNOSIS — G894 Chronic pain syndrome: Secondary | ICD-10-CM | POA: Diagnosis not present

## 2015-12-23 DIAGNOSIS — Z79899 Other long term (current) drug therapy: Secondary | ICD-10-CM

## 2015-12-23 DIAGNOSIS — M542 Cervicalgia: Secondary | ICD-10-CM | POA: Diagnosis not present

## 2015-12-23 DIAGNOSIS — G8929 Other chronic pain: Secondary | ICD-10-CM

## 2015-12-23 DIAGNOSIS — Z5181 Encounter for therapeutic drug level monitoring: Secondary | ICD-10-CM | POA: Diagnosis not present

## 2015-12-23 NOTE — Telephone Encounter (Signed)
He is coming in to see Richard Allison today at 3:30

## 2015-12-23 NOTE — Telephone Encounter (Signed)
error 

## 2015-12-23 NOTE — Telephone Encounter (Signed)
I spoke with Janyth PupaNicholas and he is going to come in today for a 3:30 with Riley Lamunice (arrive by 3:15)

## 2015-12-23 NOTE — Telephone Encounter (Signed)
[name] has an appointment set for 11/29 with Dr. [name]. " Richard Allison stated he can not make that appointment he has to work out of town and he would like to be seen today if it is possible , his pain is unbearable" Please advise .
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
## 2015-12-30 ENCOUNTER — Encounter: Payer: Medicaid Other | Admitting: Physical Medicine & Rehabilitation

## 2016-01-04 ENCOUNTER — Telehealth: Payer: Self-pay | Admitting: Physical Medicine & Rehabilitation

## 2016-01-04 NOTE — Telephone Encounter (Signed)
Patient wanted to cancel his appointment on 12/8, [place]  I explained to him that he would not get any medications until he is seen.  I have rescheduled his appointment for 12/28 with [name].

## 2016-01-08 ENCOUNTER — Ambulatory Visit: Payer: Medicaid Other | Admitting: Physical Medicine & Rehabilitation

## 2016-01-15 ENCOUNTER — Ambulatory Visit: Payer: Medicaid Other | Admitting: Physical Medicine & Rehabilitation

## 2016-01-28 ENCOUNTER — Encounter: Payer: Medicaid Other | Attending: Physical Medicine & Rehabilitation | Admitting: Physical Medicine & Rehabilitation

## 2016-01-28 ENCOUNTER — Encounter: Payer: Self-pay | Admitting: Physical Medicine & Rehabilitation

## 2016-01-28 VITALS — BP 164/98 | HR 87

## 2016-01-28 DIAGNOSIS — M542 Cervicalgia: Secondary | ICD-10-CM

## 2016-01-28 DIAGNOSIS — G894 Chronic pain syndrome: Secondary | ICD-10-CM

## 2016-01-28 DIAGNOSIS — M545 Low back pain, unspecified: Secondary | ICD-10-CM

## 2016-01-28 DIAGNOSIS — G479 Sleep disorder, unspecified: Secondary | ICD-10-CM | POA: Diagnosis not present

## 2016-01-28 DIAGNOSIS — Z720 Tobacco use: Secondary | ICD-10-CM

## 2016-01-28 DIAGNOSIS — Z981 Arthrodesis status: Secondary | ICD-10-CM | POA: Insufficient documentation

## 2016-01-28 DIAGNOSIS — F17210 Nicotine dependence, cigarettes, uncomplicated: Secondary | ICD-10-CM | POA: Diagnosis not present

## 2016-01-28 DIAGNOSIS — M4802 Spinal stenosis, cervical region: Secondary | ICD-10-CM | POA: Insufficient documentation

## 2016-01-28 DIAGNOSIS — M791 Myalgia: Secondary | ICD-10-CM | POA: Insufficient documentation

## 2016-01-28 DIAGNOSIS — M549 Dorsalgia, unspecified: Secondary | ICD-10-CM | POA: Insufficient documentation

## 2016-01-28 DIAGNOSIS — M5126 Other intervertebral disc displacement, lumbar region: Secondary | ICD-10-CM | POA: Diagnosis not present

## 2016-01-28 DIAGNOSIS — G8929 Other chronic pain: Secondary | ICD-10-CM

## 2016-01-28 DIAGNOSIS — Z716 Tobacco abuse counseling: Secondary | ICD-10-CM

## 2016-01-28 MED ORDER — TRAMADOL HCL 50 MG PO TABS: 100.0000 mg | ORAL_TABLET | Freq: Four times a day (QID) | ORAL | 0 refills | Status: DC | PRN

## 2016-01-28 MED ORDER — GABAPENTIN (ONCE-DAILY) 600 MG PO TABS: 600.0000 mg | ORAL_TABLET | Freq: Every day | ORAL | 1 refills | Status: DC

## 2016-01-28 MED ORDER — CYCLOBENZAPRINE HCL 10 MG PO TABS: 10.0000 mg | ORAL_TABLET | Freq: Every evening | ORAL | 1 refills | Status: DC | PRN

## 2016-01-28 MED ORDER — METHOCARBAMOL 500 MG PO TABS: 500.0000 mg | ORAL_TABLET | Freq: Three times a day (TID) | ORAL | 1 refills | Status: AC | PRN

## 2016-01-28 NOTE — Addendum Note (Signed)
Addended by: Maryla MorrowPATEL, Jezreel Justiniano A on: 01/28/2016 12:21 PM   Modules accepted: Orders
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
## 2016-02-22 ENCOUNTER — Telehealth: Payer: Self-pay | Admitting: Registered Nurse

## 2016-02-22 MED ORDER — TRAMADOL HCL 50 MG PO TABS: 100.0000 mg | ORAL_TABLET | Freq: Four times a day (QID) | ORAL | 0 refills | Status: DC | PRN

## 2016-02-22 NOTE — Telephone Encounter (Signed)
Placed a call to [name] to change his appointment to next week, he stated he's in agreement to change his appointment. He will be abler to pick up his Tramadol on the 26th, he verbalizes understanding.

## 2016-02-25 ENCOUNTER — Ambulatory Visit: Payer: Medicaid Other | Admitting: Registered Nurse

## 2016-02-26 ENCOUNTER — Other Ambulatory Visit: Payer: Self-pay | Admitting: Physical Medicine & Rehabilitation

## 2016-02-26 NOTE — Telephone Encounter (Signed)
Received refill request from patient for this medication, noted in your last note:  Unable to tolerate Mobic due to GI bleeding  Please advise for this refill

## 2016-03-02 ENCOUNTER — Encounter: Payer: Medicaid Other | Attending: Physical Medicine & Rehabilitation | Admitting: Registered Nurse

## 2016-03-02 ENCOUNTER — Telehealth: Payer: Self-pay | Admitting: Registered Nurse

## 2016-03-02 ENCOUNTER — Encounter: Payer: Self-pay | Admitting: Registered Nurse

## 2016-03-02 VITALS — BP 136/96 | HR 86

## 2016-03-02 DIAGNOSIS — Z981 Arthrodesis status: Secondary | ICD-10-CM | POA: Diagnosis not present

## 2016-03-02 DIAGNOSIS — M5126 Other intervertebral disc displacement, lumbar region: Secondary | ICD-10-CM | POA: Insufficient documentation

## 2016-03-02 DIAGNOSIS — G479 Sleep disorder, unspecified: Secondary | ICD-10-CM | POA: Insufficient documentation

## 2016-03-02 DIAGNOSIS — M549 Dorsalgia, unspecified: Secondary | ICD-10-CM | POA: Diagnosis present

## 2016-03-02 DIAGNOSIS — M545 Low back pain: Secondary | ICD-10-CM

## 2016-03-02 DIAGNOSIS — G894 Chronic pain syndrome: Secondary | ICD-10-CM

## 2016-03-02 DIAGNOSIS — M791 Myalgia: Secondary | ICD-10-CM | POA: Insufficient documentation

## 2016-03-02 DIAGNOSIS — Z720 Tobacco use: Secondary | ICD-10-CM | POA: Diagnosis not present

## 2016-03-02 DIAGNOSIS — M542 Cervicalgia: Secondary | ICD-10-CM | POA: Diagnosis not present

## 2016-03-02 DIAGNOSIS — G8929 Other chronic pain: Secondary | ICD-10-CM

## 2016-03-02 DIAGNOSIS — M4802 Spinal stenosis, cervical region: Secondary | ICD-10-CM | POA: Insufficient documentation

## 2016-03-02 DIAGNOSIS — Z79899 Other long term (current) drug therapy: Secondary | ICD-10-CM

## 2016-03-02 DIAGNOSIS — Z5181 Encounter for therapeutic drug level monitoring: Secondary | ICD-10-CM

## 2016-03-02 DIAGNOSIS — F17210 Nicotine dependence, cigarettes, uncomplicated: Secondary | ICD-10-CM | POA: Insufficient documentation

## 2016-03-02 NOTE — Progress Notes (Signed)
Subjective:    Patient ID: Richard Allison, male    DOB: 19-Apr-1985, 31 y.o.   MRN: [id number]  HPI: [name] is a 31 year old male who returns for follow up appointment for chronic pain and medication refill. He states his pain is located in his neck and lower back. He rates his pain 8. His current exercise regime is walking.   Pain Inventory Average Pain 9 Pain Right Now 8 My pain is sharp, dull, stabbing and aching  In the last 24 hours, has pain interfered with the following? General activity 8 Relation with others 8 Enjoyment of life 8 What TIME of day is your pain at its worst? . Sleep (in general) Poor  Pain is worse with: walking, bending, sitting and some activites Pain improves with: rest and medication Relief from Meds: 3  Mobility walk without assistance ability to climb steps?  yes do you drive?  yes  Function employed # of hrs/week 30 I need assistance with the following:  dressing  Neuro/Psych No problems in this area  Prior Studies Any changes since last visit?  no  Physicians involved in your care Any changes since last visit?  no   Family History  Problem Relation Age of Onset  . Heart disease Father   . Diabetes Father   . Hyperlipidemia Father    Social History   Social History  . Marital status: Legally Separated    Spouse name: N/A  . Number of children: N/A  . Years of education: N/A   Social History Main Topics  . Smoking status: Current Every Day Smoker    Packs/day: 1.00    Types: Cigarettes  . Smokeless tobacco: Current User  . Alcohol use No  . Drug use: No  . Sexual activity: Yes    Birth control/ protection: None   Other Topics Concern  . Not on file   Social History Narrative  . No narrative on file   Past Surgical History:  Procedure Laterality Date  . BACK SURGERY     No past medical history on file. There were no vitals taken for this visit.  Opioid Risk Score:   Fall Risk Score:   `1  Depression screen PHQ 2/9  [facility] 12/16/2015 10/22/2015 07/31/2015  Decreased Interest 2 2 2   Down, Depressed, Hopeless 2 2 2   PHQ - 2 Score 4 4 4   Altered sleeping - - 3  Tired, decreased energy - - 3  Change in appetite - - 2  Feeling bad or failure about yourself  - - 3  Trouble concentrating - - 2  Moving slowly or fidgety/restless - - 2  Suicidal thoughts - - 0  PHQ-9 Score - - 19  Difficult doing work/chores - - Extremely dIfficult   Review of Systems  Constitutional: Negative.   HENT: Negative.   Eyes: Negative.   Respiratory: Negative.   Cardiovascular: Negative.   Gastrointestinal: Negative.   Endocrine: Negative.   Genitourinary: Negative.   Musculoskeletal: Positive for back pain and neck pain.  Skin: Negative.   Allergic/Immunologic: Negative.   Neurological: Negative.   Hematological: Negative.   Psychiatric/Behavioral: Negative.   All other systems reviewed and are negative.      Objective:   Physical Exam  Constitutional: He is oriented to person, place, and time. He appears well-developed and well-nourished.  HENT:  Head: Normocephalic and atraumatic.  Neck: Normal range of motion. Neck supple.  Cervical Paraspinal Tenderness: C-5-C-6  Musculoskeletal:  Normal Muscle Bulk and Muscle Testing Reveals: Upper Extremities: Full ROM and Muscle Strength 5/5 Lumbar Paraspinal Tenderness: L-3-L-5 Lower Extremities: Full ROM and Muscle Strength 5/5 Left Lower Extremity Flexion Produces Pain into Lower Back Mainly left side Arises from Table with ease Narrow Based Gait  Neurological: He is alert and oriented to person, place, and time.  Skin: Skin is warm and dry.  Psychiatric: He has a normal mood and affect.  Nursing note and vitals reviewed.         Assessment & Plan:  1. Chronic cervical neck pain:  MRI 08/2015, showing Left-sided disc and osteophyte complex at C5-6: Continue with Heat/Ice Therapy and HEP.  Refilled:  Tramadol 50 mg tablets take 2 tablets (100mg ) every 6 hours as needed #240 We will continue the opioid monitoring program, this consists of regular clinic visits, examinations, urine drug screen, pill counts as well as use of [place] Controlled Substance Reporting System. 2. Chronic Mechanic low back pain: MRI 05/2015: 1. Posterior lumbar interbody fusion from L4 through S1 without failure or complication. No foraminal or central canal stenosis.  At L2-3, there is a small left paracentral disc protrusion. Continue with HEP, continue to monitor. Continue current medication regime 3. Sleep disturbance: Continue to Monitor 4. Muscle Spasm: Continue Robaxin/ and Flexeril  20 minutes of face to face patient care time was spent during this visit. All questions were encouraged and answered.   F/U in 1 month

## 2016-03-02 NOTE — Telephone Encounter (Signed)
On January 31,2018 the  [facility] was reviewed no conflict was seen on the [place] Cont[place] System with multiple prescribers. [name] has a signed narcotic contract with our office. If there were any discrepancies this would have been reported to his physician.

## 2016-03-03 ENCOUNTER — Telehealth: Payer: Self-pay | Admitting: *Deleted

## 2016-03-03 NOTE — Telephone Encounter (Signed)
Richard Allison requests call back about Richard Allison.  I spoke with her this morning and she was asking how we sedate him for injections.  I explained we do not sedate other than diazepam po if needed but that Richard Allison has refused injections from our office due to "phobia of needles". We are no longer willing to prescribe narcotics for Richard Allison.

## 2016-03-21 ENCOUNTER — Other Ambulatory Visit: Payer: Self-pay | Admitting: Orthopedic Surgery

## 2016-03-21 DIAGNOSIS — M5412 Radiculopathy, cervical region: Secondary | ICD-10-CM

## 2016-03-21 NOTE — Telephone Encounter (Signed)
error 

## 2016-03-23 ENCOUNTER — Encounter: Payer: Medicaid Other | Attending: Physical Medicine & Rehabilitation | Admitting: Physical Medicine & Rehabilitation

## 2016-03-23 ENCOUNTER — Encounter: Payer: Self-pay | Admitting: Physical Medicine & Rehabilitation

## 2016-03-23 VITALS — BP 130/94 | HR 108 | Resp 14

## 2016-03-23 DIAGNOSIS — F17210 Nicotine dependence, cigarettes, uncomplicated: Secondary | ICD-10-CM | POA: Insufficient documentation

## 2016-03-23 DIAGNOSIS — G894 Chronic pain syndrome: Secondary | ICD-10-CM | POA: Diagnosis not present

## 2016-03-23 DIAGNOSIS — M5126 Other intervertebral disc displacement, lumbar region: Secondary | ICD-10-CM | POA: Insufficient documentation

## 2016-03-23 DIAGNOSIS — G8929 Other chronic pain: Secondary | ICD-10-CM

## 2016-03-23 DIAGNOSIS — M549 Dorsalgia, unspecified: Secondary | ICD-10-CM | POA: Insufficient documentation

## 2016-03-23 DIAGNOSIS — M791 Myalgia, unspecified site: Secondary | ICD-10-CM

## 2016-03-23 DIAGNOSIS — Z981 Arthrodesis status: Secondary | ICD-10-CM | POA: Insufficient documentation

## 2016-03-23 DIAGNOSIS — M545 Low back pain, unspecified: Secondary | ICD-10-CM

## 2016-03-23 DIAGNOSIS — M4802 Spinal stenosis, cervical region: Secondary | ICD-10-CM | POA: Diagnosis not present

## 2016-03-23 DIAGNOSIS — M25512 Pain in left shoulder: Secondary | ICD-10-CM | POA: Diagnosis not present

## 2016-03-23 DIAGNOSIS — G479 Sleep disorder, unspecified: Secondary | ICD-10-CM

## 2016-03-23 DIAGNOSIS — M542 Cervicalgia: Secondary | ICD-10-CM | POA: Diagnosis not present

## 2016-03-23 MED ORDER — CYCLOBENZAPRINE HCL 10 MG PO TABS: 10.0000 mg | ORAL_TABLET | Freq: Every evening | ORAL | 1 refills | Status: DC | PRN

## 2016-03-23 MED ORDER — TRAMADOL HCL 50 MG PO TABS: 100.0000 mg | ORAL_TABLET | Freq: Four times a day (QID) | ORAL | 0 refills | Status: DC | PRN

## 2016-03-23 NOTE — Progress Notes (Signed)
Subjective:    Patient ID: Richard Allison, male    DOB: [birth year], 31 y.o.   MRN: [id number]  HPI  31 y/o male with pmh of tobacco abuse, back pain s/p lumbar fusion presents neck > back pain.  Neck pain located on left side.  Back pain on right side.  Started in 2001 after heavy lifting.  Back surgery was in 2009. Neck is getting progressively worse since 2012.  Prolonged activity exacerbate the pain.  Chiropractor improves the pain. Sharp pain.  Pain radiates to elbow.  Before going to the chiropractor is travel down to his hand.  Constant.  Ice helps.  Flexaril helps pt go to sleep.  Denies associated numbness, tingling, weakness.  This has resulted in poor sleep. It limits him from doing daily activities and working. It also limits his interactions with others. Pt had C-spine MRI 06/2014 and L-spine 05/2015. He has had a repeat MRI and was referred to Neurosurg. Pt has now failed 2 UDS and was informed, he would not be prescribed narcotics.   Last clinic visit 03/02/16 by NP.  Records reviewed. Since last visit, He states he never obtained the TENS unit because his chiropractor has been using.  He continues to take Baclofen 10mg  qhs. He taken Robaxin and Flexaril as well.  He was referred to Neurosurg for ESIs under anesthesia.  He states he still has to schedule an appointment.    Pain Inventory Average Pain 9 Pain Right Now 8 My pain is sharp, burning, dull, stabbing and aching  In the last 24 hours, has pain interfered with the following? General activity 8 Relation with others 8 Enjoyment of life 8 What TIME of day is your pain at its worst? morning, evening, night Sleep (in general) Poor  Pain is worse with: walking, bending, sitting, standing and some activites Pain improves with: medication Relief from Meds: 3  Mobility walk without assistance how many minutes can you walk? 30 ability to climb steps?  yes do you drive?  yes Do you have any goals in this area?   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  yes  Neuro/Psych No problems in this area  Prior Studies Any changes since last visit?  no  Physicians involved in your care Any changes since last visit?  no   Family History  Problem Relation Age of Onset  . Heart disease Father   . Diabetes Father   . Hyperlipidemia Father    Social History   Social History  . Marital status: Legally Separated    Spouse name: N/A  . Number of children: N/A  . Years of education: N/A   Social History Main Topics  . Smoking status: Current Every Day Smoker    Packs/day: 1.00    Types: Cigarettes  . Smokeless tobacco: Current User  . Alcohol use No  . Drug use: No  . Sexual activity: Yes    Birth control/ protection: None   Other Topics Concern  . None   Social History Narrative  . None   Past Surgical History:  Procedure Laterality Date  . BACK SURGERY     History reviewed. No pertinent past medical history. BP (!) 130/94   Pulse (!) 108   Resp 14   SpO2 98%   Opioid Risk Score:   Fall Risk Score:  `1  Depression screen  PHQ 2/9  [facility] 2/9 12/16/2015 10/22/2015 07/31/2015  Decreased Interest 2 2 2   Down, Depressed, Hopeless 2 2 2   PHQ - 2 Score 4 4 4   Altered sleeping - - 3  Tired, decreased energy - - 3  Change in appetite - - 2  Feeling bad or failure about yourself  - - 3  Trouble concentrating - - 2  Moving slowly or fidgety/restless - - 2  Suicidal thoughts - - 0  PHQ-9 Score - - 19  Difficult doing work/chores - - Extremely dIfficult   Review of Systems  Constitutional: Negative.   HENT: Negative.   Eyes: Negative.   Respiratory: Negative.   Cardiovascular: Negative.   Gastrointestinal: Negative.   Endocrine: Negative.   Genitourinary: Negative.   Musculoskeletal: Positive for back pain, myalgias and neck pain.  Skin:  Negative.   Allergic/Immunologic: Negative.   Neurological: Negative.        Spasms   Hematological: Negative.   Psychiatric/Behavioral: Negative.   All other systems reviewed and are negative.     Objective:   Physical Exam Gen: NAD. Vital signs reviewed HENT: Normocephalic, Atraumatic Eyes: EOMI, No discharge.  Cardio: RRR. No JVD. Pulm: B/l clear to auscultation.  Effort normal Abd: Soft, BS+ MSK:  Gait WNL.   TTP over C-spine, PSP, and right upper trap.    No edema.   LUE pain with ROM Neuro:   Sensation intact to light touch in all UE dermatomes  Strength  5/5 in all RUE myotomes    4/5 in all LUE myotomes, except for shoulder (pain inhibition) Skin: Warm and Dry. Intact.  Psych: Normal mood and affect.     Assessment & Plan:  31 y/o male with pmh of tobacco abuse, back pain s/p lumbar fusion presents for follow up of neck > back pain.  1. Chronic cervical neck pain  MRI 08/2015, showing Left-sided disc and osteophyte complex at C5-6 similar to the prior study. This is causing cord flattening and mild spinal stenosis with mild-to-moderate foraminal encroachment. No change from the prior MRI. Central and left-sided disc protrusion with left-sided spurring at C6-7 unchanged from the prior study. Chronic mild left foraminal narrowing unchanged. 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  Cymbalta 60 makes pt too fatigued  Pt went to PT for 1 visit  Cont aquatic therapy in summer.   Cont Ice  TENs effective, encouraged pt to obtain again.  Currently seeing a chiropractor, who performs TENS  Elavil switched to Nortryptaline, but pt only occasionally taking due to sedation.  He has not tried the Nortryptaline  Cont Baclofen 10 qhs PRN  Cont Robaxin 500 TID PRN  Cont Flexaril qHS  Tramadol works most of the time, refilled, 50 4/day PRN  UDS  inconsistent x2, will not prescribe narcotics  Referral to for ESIs under sedation, pt needs to call to make appointment  Pt saw Surgery for eval, who also recommended ESIs    2. Chronic Mechanic low back pain  MRI 05/2015: 1. Posterior lumbar interbody fusion from L4 through S1 without failure or complication. No foraminal or central canal stenosis. 2. At L2-3, there is a small left paracentral disc protrusion.  See above  3. Tobacco abuse  Educated and encouraged on cessation again  4. Sleep disturbance  Cont activity  Nortryptaline as needed  Cont new  mattress soon   5. Myalgia  Cont Baclofen 10 qhs  Pt unable to tolerate injections due to severe phobia, cont to readdress  6. Acute left shoulder pain  After ?trauma  Will order xray

## 2016-04-18 ENCOUNTER — Telehealth: Payer: Self-pay | Admitting: *Deleted

## 2016-04-18 NOTE — Telephone Encounter (Signed)
Mr Richard Allison called to asked that we go ahead and refill his medications tomorrow because it is going to snow on Wednesday (has appt that day 04/21/16 )

## 2016-04-19 NOTE — Telephone Encounter (Signed)
If it snows such that he is unable to make his appointment we can refill his medication after the fact.  Thanks

## 2016-04-20 ENCOUNTER — Encounter: Payer: Medicaid Other | Admitting: Physical Medicine & Rehabilitation

## 2016-04-20 ENCOUNTER — Encounter: Payer: Self-pay | Admitting: Physical Medicine & Rehabilitation

## 2016-04-20 ENCOUNTER — Telehealth: Payer: Self-pay | Admitting: *Deleted

## 2016-04-20 ENCOUNTER — Encounter: Payer: Medicaid Other | Attending: Physical Medicine & Rehabilitation | Admitting: Physical Medicine & Rehabilitation

## 2016-04-20 VITALS — BP 144/89 | HR 110 | Resp 14

## 2016-04-20 DIAGNOSIS — M4802 Spinal stenosis, cervical region: Secondary | ICD-10-CM | POA: Insufficient documentation

## 2016-04-20 DIAGNOSIS — F17210 Nicotine dependence, cigarettes, uncomplicated: Secondary | ICD-10-CM | POA: Diagnosis not present

## 2016-04-20 DIAGNOSIS — M791 Myalgia, unspecified site: Secondary | ICD-10-CM

## 2016-04-20 DIAGNOSIS — M545 Low back pain, unspecified: Secondary | ICD-10-CM

## 2016-04-20 DIAGNOSIS — Z720 Tobacco use: Secondary | ICD-10-CM | POA: Diagnosis not present

## 2016-04-20 DIAGNOSIS — G8929 Other chronic pain: Secondary | ICD-10-CM | POA: Diagnosis not present

## 2016-04-20 DIAGNOSIS — Z981 Arthrodesis status: Secondary | ICD-10-CM | POA: Diagnosis not present

## 2016-04-20 DIAGNOSIS — M549 Dorsalgia, unspecified: Secondary | ICD-10-CM | POA: Insufficient documentation

## 2016-04-20 DIAGNOSIS — M5126 Other intervertebral disc displacement, lumbar region: Secondary | ICD-10-CM | POA: Diagnosis not present

## 2016-04-20 DIAGNOSIS — G894 Chronic pain syndrome: Secondary | ICD-10-CM

## 2016-04-20 DIAGNOSIS — M542 Cervicalgia: Secondary | ICD-10-CM | POA: Diagnosis present

## 2016-04-20 DIAGNOSIS — G479 Sleep disorder, unspecified: Secondary | ICD-10-CM

## 2016-04-20 MED ORDER — TRAZODONE HCL 50 MG PO TABS: 50.0000 mg | ORAL_TABLET | Freq: Every day | ORAL | 1 refills | Status: DC

## 2016-04-20 MED ORDER — TRAMADOL HCL 50 MG PO TABS: 50.0000 mg | ORAL_TABLET | Freq: Four times a day (QID) | ORAL | 0 refills | Status: DC | PRN

## 2016-04-20 NOTE — Progress Notes (Signed)
Subjective:    Patient ID: Richard Allison, male    DOB: 1985/04/05, 31 y.o.   MRN: [id number]  HPI 31 y/o male with pmh of tobacco abuse, back pain s/p lumbar fusion presents for follow up of neck > back pain.   Initially stated: Neck pain located on left side.  Back pain on right side.  Started in 2001 after heavy lifting.  Back surgery was in 2009. Neck is getting progressively worse since 2012.  Prolonged activity exacerbate the pain.  Chiropractor improves the pain. Sharp pain.  Pain radiates to elbow.  Before going to the chiropractor is travel down to his hand.  Constant.  Ice helps.  Flexaril helps pt go to sleep.  Denies associated numbness, tingling, weakness.  This has resulted in poor sleep. It limits him from doing daily activities and working. It also limits his interactions with others. Pt had C-spine MRI 06/2014 and L-spine 05/2015. He has had a repeat MRI and was referred to Neurosurg. Pt has now failed 2 UDS and was informed, he would not be prescribed narcotics.   Last clinic visit 03/23/16.  He is currently taking Flexaril, Robaxin, Baclofen, rotating between meds.  He continues to take Tramadol with some benefit.  He states he still has not heard back about his ESIs and states he will call today.  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 He states he never obtained shoulder xray because he said the pain resolved.   Pain Inventory Average Pain 8 Pain Right Now 8 My pain is sharp, burning, dull and stabbing  In the last 24 hours, has pain interfered with the following? General activity 6 Relation with others 6 Enjoyment of life 6 What TIME of day is your pain at its worst? morning, evening, night Sleep (in general) Poor  Pain is worse with: walking, bending, sitting, standing and some activites Pain improves with: rest and medication Relief from Meds: no selection  Mobility walk without assistance how many minutes can you walk? 30 ability to climb steps?  yes do you drive?   yes Do you have any goals in this area?  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  yes  Neuro/Psych No problems in this area  Prior Studies Any changes since last visit?  no  Physicians involved in your care Any changes since last visit?  no   Family History  Problem Relation Age of Onset  . Heart disease Father   . Diabetes Father   . Hyperlipidemia Father    Social History   Social History  . Marital status: Legally Separated    Spouse name: N/A  . Number of children: N/A  . Years of education: N/A   Social History Main Topics  . Smoking status: Current Every Day Smoker    Packs/day: 1.00    Types: Cigarettes  . Smokeless tobacco: Current User  . Alcohol use No  . Drug use: No  . Sexual activity: Yes    Birth control/ protection: None   Other Topics Concern  . None   Social History Narrative  . None   Past Surgical History:  Procedure Laterality Date  . BACK SURGERY     History reviewed. No pertinent past medical history. BP (!) 144/89 (BP Location: Left Arm, Patient Position: Sitting, Cuff Size: Large)   Pulse (!) 110   Resp 14  SpO2 99%   Opioid Risk Score:   Fall Risk Score:  `1  Depression screen PHQ 2/9  Depression screen [facility] 2/9 12/16/2015 10/22/2015 07/31/2015  Decreased Interest 2 2 2   Down, Depressed, Hopeless 2 2 2   PHQ - 2 Score 4 4 4   Altered sleeping - - 3  Tired, decreased energy - - 3  Change in appetite - - 2  Feeling bad or failure about yourself  - - 3  Trouble concentrating - - 2  Moving slowly or fidgety/restless - - 2  Suicidal thoughts - - 0  PHQ-9 Score - - 19  Difficult doing work/chores - - Extremely dIfficult    Review of Systems  Constitutional: Negative.   HENT: Negative.   Eyes: Negative.   Respiratory: Negative.   Cardiovascular: Negative.   Gastrointestinal: Negative.   Endocrine: Negative.    Genitourinary: Negative.   Musculoskeletal: Positive for back pain.  Skin: Negative.   Allergic/Immunologic: Negative.   Neurological: Negative.   Hematological: Negative.   Psychiatric/Behavioral: Negative.   All other systems reviewed and are negative.      Objective:   Physical Exam Gen: NAD. Vital signs reviewed HENT: Normocephalic, Atraumatic Eyes: EOMI, No discharge.  Cardio: RRR. No JVD. Pulm: B/l clear to auscultation.  Effort normal Abd: Soft, BS+ MSK:   Gait WNL.              TTP over C-spine, PSP, and right upper trap.               No edema.              LUE pain with ROM Neuro:              Sensation intact to light touch in all UE dermatomes             Strength          5/5 in all RUE myotomes                                     5/5 in all LUE myotomes Skin: Warm and Dry. Intact.  Psych: Normal mood and affect.    Assessment & Plan:  31 y/o male with pmh of tobacco abuse, back pain s/p lumbar fusion presents for follow up of neck > back pain.  1. Chronic cervical neck pain             MRI 08/2015, showing Left-sided disc and osteophyte complex at C5-6 similar to the prior study. This is causing cord flattening and mild spinal stenosis with mild-to-moderate foraminal encroachment. No change from the prior MRI. Central and left-sided disc protrusion with left-sided spurring at C6-7 unchanged from the prior study. Chronic mild left foraminal narrowing unchanged. Mild spinal stenosis             Unable to tolerate Mobic due to GI bleeding             Can't take Gabapentin and Lyrica due to drowsiness             Pt unable to tolerate injections due to severe phobia.             Cymbalta 60 makes pt too fatigued             Pt went to PT for 1 visit  Cont aquatic therapy in summer.              Cont Ice             TENs effective, encouraged pt to obtain again.  Currently seeing a chiropractor, who performs TENS             Elavil switched to  Nortryptaline, but pt only occasionally taking due to sedation.  He has not tried the Nortryptaline             Cont Baclofen 10 qhs PRN             Cont Robaxin 500 TID PRN             Cont Flexaril qHS             Tramadol works most of the time, refilled, 50 4/day PRN, refilled             UDS inconsistent x2, will not prescribe narcotics             Referral to for ESIs under sedation, pt needs to call to make appointment             Pt saw Surgery for eval, who also recommended ESIs                       2. Chronic Mechanic low back pain             MRI 05/2015: 1. Posterior lumbar interbody fusion from L4 through S1 without failure or complication. No foraminal or central canal stenosis. 2. At L2-3, there is a small left paracentral disc protrusion.  See above  3. Tobacco abuse             Educated and encouraged on cessation again  4. Sleep disturbance             Cont activity             Nortryptaline as needed  Will trial trazodone 50qhs PRN             Cont new mattress soon              5. Myalgia             Cont Baclofen 10 qhs             Pt unable to tolerate injections due to severe phobia, cont to readdress

## 2016-04-20 NOTE — Telephone Encounter (Signed)
Patient left a message with a concern about his Tramadol Rx. He was previously prescribed 100 mg QID which equated to 50 mg tabs, take 2 every 6 hours.  The script he received today was for #120 and take 50 mg 1 tablet every six hours. He claims there was no discussion about a reduction....please advise

## 2016-04-20 NOTE — Telephone Encounter (Signed)
He may have another 120 tabs for 100mg  q6 hours PRN for 30 and we will discuss potential changes after his injection.

## 2016-04-25 MED ORDER — TRAMADOL HCL 50 MG PO TABS: 50.0000 mg | ORAL_TABLET | Freq: Four times a day (QID) | ORAL | 0 refills | Status: DC | PRN

## 2016-04-25 NOTE — Telephone Encounter (Signed)
Contacted pt, left message explaining that 2nd script phoned in and Rx will be discussed at next visit

## 2016-05-18 ENCOUNTER — Encounter: Payer: Medicaid Other | Attending: Physical Medicine & Rehabilitation | Admitting: Physical Medicine & Rehabilitation

## 2016-05-18 ENCOUNTER — Encounter: Payer: Self-pay | Admitting: Physical Medicine & Rehabilitation

## 2016-05-18 VITALS — BP 137/83 | HR 74

## 2016-05-18 DIAGNOSIS — F17210 Nicotine dependence, cigarettes, uncomplicated: Secondary | ICD-10-CM | POA: Insufficient documentation

## 2016-05-18 DIAGNOSIS — G479 Sleep disorder, unspecified: Secondary | ICD-10-CM | POA: Diagnosis not present

## 2016-05-18 DIAGNOSIS — M4802 Spinal stenosis, cervical region: Secondary | ICD-10-CM | POA: Insufficient documentation

## 2016-05-18 DIAGNOSIS — M549 Dorsalgia, unspecified: Secondary | ICD-10-CM | POA: Insufficient documentation

## 2016-05-18 DIAGNOSIS — M5126 Other intervertebral disc displacement, lumbar region: Secondary | ICD-10-CM | POA: Insufficient documentation

## 2016-05-18 DIAGNOSIS — G894 Chronic pain syndrome: Secondary | ICD-10-CM | POA: Insufficient documentation

## 2016-05-18 DIAGNOSIS — M545 Low back pain, unspecified: Secondary | ICD-10-CM

## 2016-05-18 DIAGNOSIS — M791 Myalgia, unspecified site: Secondary | ICD-10-CM

## 2016-05-18 DIAGNOSIS — G8929 Other chronic pain: Secondary | ICD-10-CM

## 2016-05-18 DIAGNOSIS — M542 Cervicalgia: Secondary | ICD-10-CM | POA: Diagnosis not present

## 2016-05-18 DIAGNOSIS — Z981 Arthrodesis status: Secondary | ICD-10-CM | POA: Diagnosis not present

## 2016-05-18 DIAGNOSIS — Z720 Tobacco use: Secondary | ICD-10-CM

## 2016-05-18 MED ORDER — TRAMADOL HCL 50 MG PO TABS: 100.0000 mg | ORAL_TABLET | Freq: Four times a day (QID) | ORAL | 0 refills | Status: DC | PRN

## 2016-05-18 NOTE — Progress Notes (Signed)
Subjective:    Patient ID: Richard Allison, male    DOB: 02-05-1985, 31 y.o.   MRN: [id number]  HPI 31 y/o male with pmh of tobacco abuse, back pain s/p lumbar fusion presents for follow up of neck > back pain.   Initially stated: Neck pain located on left side.  Back pain on right side. Started in 2001 after heavy lifting.  Back surgery was in 2009. Neck is getting progressively worse since 2012.  Prolonged activity exacerbate the pain.  Chiropractor improves the pain. Sharp pain.  Pain radiates to elbow.  Before going to the chiropractor is travel down to his hand.  Constant.  Ice helps.  Flexaril helps pt go to sleep.  Denies associated numbness, tingling, weakness.  This has resulted in poor sleep. It limits him from doing daily activities and working. It also limits his interactions with others. Pt had C-spine MRI 06/2014 and L-spine 05/2015. He has had a repeat MRI and was referred to Neurosurg. Pt has now failed 2 UDS and was informed, he would not be prescribed narcotics.   Last clinic visit 04/20/16.  At that time, pt was encouraged to restart pool therapy, he states he is waiting for the summer.  He still has not obtained a TENS unit. Continues to take all of his medications as prescribed. He states he is still awaiting appointment for his ESIs. He continues to smoke the same. Trazodone is helping with sleep.    Pain Inventory Average Pain 7 Pain Right Now 8 My pain is sharp, burning, dull, stabbing and aching  In the last 24 hours, has pain interfered with the following? General activity 6 Relation with others 6 Enjoyment of life 6 What TIME of day is your pain at its worst? morning evening and night, evening, night Sleep (in general) NA  Pain is worse with: walking, bending, sitting, standing and some activites Pain improves with: rest, therapy/exercise and medication Relief from Meds: 9  Mobility walk without assistance how many minutes can you walk? 30-45 ability to  climb steps?  yes do you drive?  yes Do you have any goals in this area?  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  no  Neuro/Psych No problems in this area  Prior Studies Any changes since last visit?  no  Physicians involved in your care Any changes since last visit?  no   Family History  Problem Relation Age of Onset  . Heart disease Father   . Diabetes Father   . Hyperlipidemia Father    Social History   Social History  . Marital status: Legally Separated    Spouse name: N/A  . Number of children: N/A  . Years of education: N/A   Social History Main Topics  . Smoking status: Current Every Day Smoker    Packs/day: 1.00    Types: Cigarettes  . Smokeless tobacco: Current User  . Alcohol use No  . Drug use: No  . Sexual activity: Yes    Birth control/ protection: None   Other Topics Concern  . None   Social History Narrative  . None   Past Surgical History:  Procedure Laterality Date  . BACK SURGERY     No pertinent past medical history. BP 137/83   Pulse 74   SpO2 99%   Opioid Risk Score:   Fall Risk Score:  `  1  Depression screen PHQ 2/9  Depression screen [facility] 2/9 05/18/2016 12/16/2015 10/22/2015 07/31/2015  Decreased Interest Down, Depressed, Hopeless PHQ - 2 Score Altered sleeping - - - 3  Tired, decreased energy - - - 3  Change in appetite - - - 2  Feeling bad or failure about yourself  - - - 3  Trouble concentrating - - - 2  Moving slowly or fidgety/restless - - - 2  Suicidal thoughts - - - 0  PHQ-9 Score - - - 19  Difficult doing work/chores - - - Extremely dIfficult    Review of Systems  Constitutional: Negative.   HENT: Negative.   Eyes: Negative.   Respiratory: Negative.   Cardiovascular: Negative.   Gastrointestinal: Negative.   Endocrine: Negative.   Genitourinary: Negative.     Musculoskeletal: Positive for back pain and neck pain.  Skin: Negative.   Allergic/Immunologic: Negative.   Neurological: Negative.   Hematological: Negative.   Psychiatric/Behavioral: Negative.   All other systems reviewed and are negative.      Objective:   Physical Exam Gen: NAD. Vital signs reviewed HENT: Normocephalic, Atraumatic Eyes: EOMI, No discharge.  Cardio: RRR. No JVD. Pulm: B/l clear to auscultation.  Effort normal Abd: Soft, BS+ MSK:   Gait WNL.              TTP over C-spine, PSP, and right upper trap.               No edema.              LUE pain with ROM Neuro:              Sensation intact to light touch in all UE dermatomes             Strength          5/5 in all RUE myotomes                                     5/5 in all LUE myotomes Skin: Warm and Dry. Intact.  Psych: Normal mood and affect.    Assessment & Plan:  31 y/o male with pmh of tobacco abuse, back pain s/p lumbar fusion presents for follow up of neck > back pain.  1. Chronic cervical neck pain             MRI 08/2015, showing Left-sided disc and osteophyte complex at C5-6 similar to the prior study. This is causing cord flattening and mild spinal stenosis with mild-to-moderate foraminal encroachment. No change from the prior MRI. Central and left-sided disc protrusion with left-sided spurring at C6-7 unchanged from the prior study. Chronic mild left foraminal narrowing unchanged. Mild spinal stenosis             Unable to tolerate Mobic due to GI bleeding  Can't take Gabapentin and Lyrica due to drowsiness             Cymbalta 60 makes pt too fatigued             Pt went to PT for 1 visit             Cont aquatic therapy in summer.              Cont Ice  TENs effective, encouraged pt to obtain again (x2).  Currently seeing a chiropractor, who performs TENS             Elavil switched to Nortryptaline, but pt only occasionally taking due to sedation.  He has not tried the  Nortryptaline  Baclofen changed to 10 TID PRN  D/ced Robaxin 500 TID PRN due cost and lack of coverage  D/ced Flexaril qHS             Tramadol works most of the time, refilled, 100 4/day PRN, refilled             UDS inconsistent x2, will not prescribe narcotics             Referral to for ESIs under sedation, pt needs to call to make appointment - he states he still has not ben able to schedule             Pt saw Surgery for eval, who also recommended ESIs                       2. Chronic Mechanic low back pain             MRI 05/2015: 1. Posterior lumbar interbody fusion from L4 through S1 without failure or complication. No foraminal or central canal stenosis. 2. At L2-3, there is a small left paracentral disc protrusion.  See above  3. Tobacco abuse             Educated and encouraged on cessation again x2  4. Sleep disturbance             Cont activity             Nortryptaline as needed  Trazodone 50qhs PRN, decreased to  qHS PRN             Cont mattress soon              5. 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

## 2016-05-18 NOTE — Addendum Note (Signed)
Addended by: Maryla Morrow A on: 05/18/2016 11:15 AM   Modules accepted: Orders

## 2016-05-19 ENCOUNTER — Other Ambulatory Visit: Payer: Self-pay | Admitting: Physical Medicine & Rehabilitation

## 2016-05-20 NOTE — Telephone Encounter (Signed)
Recieved electronic medication refill request for Motrin , last note stated:  Unable to tolerate Mobic due to GI bleeding,   probably already know the answer but please advise

## 2016-06-15 ENCOUNTER — Encounter: Payer: Self-pay | Admitting: Physical Medicine & Rehabilitation

## 2016-06-15 ENCOUNTER — Encounter: Payer: Medicaid Other | Attending: Physical Medicine & Rehabilitation | Admitting: Physical Medicine & Rehabilitation

## 2016-06-15 VITALS — BP 135/65 | HR 110 | Resp 14

## 2016-06-15 DIAGNOSIS — Z981 Arthrodesis status: Secondary | ICD-10-CM | POA: Diagnosis not present

## 2016-06-15 DIAGNOSIS — M5126 Other intervertebral disc displacement, lumbar region: Secondary | ICD-10-CM | POA: Diagnosis not present

## 2016-06-15 DIAGNOSIS — G894 Chronic pain syndrome: Secondary | ICD-10-CM

## 2016-06-15 DIAGNOSIS — Z720 Tobacco use: Secondary | ICD-10-CM | POA: Diagnosis not present

## 2016-06-15 DIAGNOSIS — M791 Myalgia, unspecified site: Secondary | ICD-10-CM

## 2016-06-15 DIAGNOSIS — F17210 Nicotine dependence, cigarettes, uncomplicated: Secondary | ICD-10-CM | POA: Insufficient documentation

## 2016-06-15 DIAGNOSIS — G8929 Other chronic pain: Secondary | ICD-10-CM

## 2016-06-15 DIAGNOSIS — M545 Low back pain, unspecified: Secondary | ICD-10-CM

## 2016-06-15 DIAGNOSIS — M4802 Spinal stenosis, cervical region: Secondary | ICD-10-CM | POA: Insufficient documentation

## 2016-06-15 DIAGNOSIS — M542 Cervicalgia: Secondary | ICD-10-CM | POA: Diagnosis not present

## 2016-06-15 DIAGNOSIS — M549 Dorsalgia, unspecified: Secondary | ICD-10-CM | POA: Diagnosis present

## 2016-06-15 DIAGNOSIS — G479 Sleep disorder, unspecified: Secondary | ICD-10-CM | POA: Diagnosis not present

## 2016-06-15 MED ORDER — TRAMADOL HCL 50 MG PO TABS: 100.0000 mg | ORAL_TABLET | Freq: Four times a day (QID) | ORAL | 0 refills | Status: DC | PRN

## 2016-06-15 MED ORDER — IBUPROFEN 600 MG PO TABS: 600.0000 mg | ORAL_TABLET | Freq: Three times a day (TID) | ORAL | 1 refills | Status: AC | PRN

## 2016-06-15 NOTE — Progress Notes (Signed)
Subjective:    Patient ID: Richard Allison, male    DOB: 12-Nov-1985, 31 y.o.   MRN: [id number]  HPI 31 y/o male with pmh of tobacco abuse, back pain s/p lumbar fusion presents for follow up of neck > back pain.   Initially stated: Neck pain located on left side.  Back pain on right side. Started in 2001 after heavy lifting.  Back surgery was in 2009. Neck is getting progressively worse since 2012.  Prolonged activity exacerbate the pain.  Chiropractor improves the pain. Sharp pain.  Pain radiates to elbow.  Before going to the chiropractor is travel down to his hand.  Constant.  Ice helps.  Flexaril helps pt go to sleep.  Denies associated numbness, tingling, weakness.  This has resulted in poor sleep. It limits him from doing daily activities and working. It also limits his interactions with others. Pt had C-spine MRI 06/2014 and L-spine 05/2015. He has had a repeat MRI and was referred to Neurosurg. Pt has now failed 2 UDS and was informed, he would not be prescribed narcotics.   Last clinic visit 05/18/16.  Since last visit, pt states she has not gone to the pool.  He never obtained TENS unit. Pt states he still has not been able to schedule an appointment for ESIs.  He states his wife works and has taken days off for birthdays and has limited time off.  He has not needed to take Baclofen that much.   Pain Inventory Average Pain 8 Pain Right Now 8 My pain is sharp, burning, dull, stabbing and aching  In the last 24 hours, has pain interfered with the following? General activity 7 Relation with others 7 Enjoyment of life 7 What TIME of day is your pain at its worst? morning evening and night Sleep (in general) Poor  Pain is worse with: walking, bending, sitting, inactivity, standing and some activites Pain improves with: rest and medication Relief from Meds: 8  Mobility walk without assistance how many minutes can you walk? 30-45 ability to climb steps?  yes do you drive?  yes Do  you have any goals in this area?  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  no  Neuro/Psych No problems in this area  Prior Studies Any changes since last visit?  no  Physicians involved in your care Any changes since last visit?  no   Family History  Problem Relation Age of Onset  . Heart disease Father   . Diabetes Father   . Hyperlipidemia Father    Social History   Social History  . Marital status: Legally Separated    Spouse name: N/A  . Number of children: N/A  . Years of education: N/A   Social History Main Topics  . Smoking status: Current Every Day Smoker    Packs/day: 1.00    Types: Cigarettes  . Smokeless tobacco: Current User  . Alcohol use No  . Drug use: No  . Sexual activity: Yes    Birth control/ protection: None   Other Topics Concern  . None   Social History Narrative  . None   Past Surgical History:  Procedure Laterality Date  . BACK SURGERY     No pertinent past medical history. BP 135/65 (BP Location: Right Arm, Patient Position: Sitting, Cuff Size: Normal)   Pulse (!) 110   Resp 14  SpO2 98%   Opioid Risk Score:   Fall Risk Score:  `1  Depression screen PHQ 2/9  Depression screen [facility] 2/9 05/18/2016 12/16/2015 10/22/2015 07/31/2015  Decreased Interest 2 2 2 2   Down, Depressed, Hopeless 2 2 2 2   PHQ - 2 Score 4 4 4 4   Altered sleeping - - - 3  Tired, decreased energy - - - 3  Change in appetite - - - 2  Feeling bad or failure about yourself  - - - 3  Trouble concentrating - - - 2  Moving slowly or fidgety/restless - - - 2  Suicidal thoughts - - - 0  PHQ-9 Score - - - 19  Difficult doing work/chores - - - Extremely dIfficult    Review of Systems  Constitutional: Negative.   HENT: Negative.   Eyes: Negative.   Respiratory: Negative.   Cardiovascular: Negative.   Gastrointestinal: Negative.   Endocrine:  Negative.   Genitourinary: Negative.   Musculoskeletal: Positive for back pain and neck pain.  Skin: Negative.   Allergic/Immunologic: Negative.   Neurological: Negative.   Hematological: Negative.   Psychiatric/Behavioral: Negative.   All other systems reviewed and are negative.      Objective:   Physical Exam Gen: NAD. Vital signs reviewed HENT: Normocephalic, Atraumatic Eyes: EOMI, No discharge.  Cardio: RRR. No JVD. Pulm: B/l clear to auscultation.  Effort normal Abd: Soft, BS+ MSK:   Gait WNL.              TTP over C-spine, PSP, and right upper trap.               No edema.              LUE pain with ROM Neuro:              Sensation intact to light touch in all UE dermatomes             Strength          5/5 in all RUE myotomes                                     5/5 in all LUE myotomes Skin: Warm and Dry. Intact.  Psych: Normal mood and affect.    Assessment & Plan:  31 y/o male with pmh of tobacco abuse, back pain s/p lumbar fusion presents for follow up of neck > back pain.  1. Chronic cervical neck pain             MRI 08/2015, showing Left-sided disc and osteophyte complex at C5-6 similar to the prior study. This is causing cord flattening and mild spinal stenosis with mild-to-moderate foraminal encroachment. No change from the prior MRI. Central and left-sided disc protrusion with left-sided spurring at C6-7 unchanged from the prior study. Chronic mild left foraminal narrowing unchanged. Mild spinal stenosis             Unable to tolerate Mobic due to GI bleeding  Can't take Gabapentin and Lyrica due to drowsiness             Cymbalta 60 makes pt too fatigued             Pt went to PT for 1 visit             Cont aquatic therapy in summer, encouraged follow up.  Cont Ice             TENs effective, encouraged pt to obtain again (x3).  Currently seeing a chiropractor, who performs TENS  Elavil switched to Nortryptaline, but pt only occasionally taking  due to sedation.  He has not tried the Nortryptaline  Baclofen changed to 10 TID PRN  D/ced Robaxin 500 TID PRN due cost and lack of coverage  D/ced Flexaril qHS             Tramadol works most of the time, refilled, 100 4/day PRN, refilled  Cont Ibu 600 TID PRN             UDS inconsistent x2, will not prescribe narcotics  Referral to for ESIs under sedation, pt needs to call to make appointment - he states he still has not ben able to schedule, still has not made             Pt saw Surgery for eval, who also recommended ESIs                       2. Chronic Mechanic low back pain             MRI 05/2015: 1. Posterior lumbar interbody fusion from L4 through S1 without failure or complication. No foraminal or central canal stenosis. 2. At L2-3, there is a small left paracentral disc protrusion.  See above  3. Tobacco abuse             Educated and encouraged on cessation again, 1/2 PPD.  4. Sleep disturbance             Cont activity             Nortryptaline as needed  Trazodone 50qhs PRN, decreased to 25mg  qHS PRN, d/ced due to side effects - pt states he gets headaches with it.               Cont mattress soon              5. 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

## 2016-07-14 ENCOUNTER — Encounter: Payer: Medicaid Other | Attending: Physical Medicine & Rehabilitation | Admitting: Physical Medicine & Rehabilitation

## 2016-07-14 ENCOUNTER — Encounter: Payer: Self-pay | Admitting: Physical Medicine & Rehabilitation

## 2016-07-14 VITALS — BP 126/88 | HR 110

## 2016-07-14 DIAGNOSIS — M791 Myalgia, unspecified site: Secondary | ICD-10-CM

## 2016-07-14 DIAGNOSIS — G894 Chronic pain syndrome: Secondary | ICD-10-CM | POA: Diagnosis present

## 2016-07-14 DIAGNOSIS — Z981 Arthrodesis status: Secondary | ICD-10-CM | POA: Diagnosis not present

## 2016-07-14 DIAGNOSIS — M4802 Spinal stenosis, cervical region: Secondary | ICD-10-CM | POA: Insufficient documentation

## 2016-07-14 DIAGNOSIS — Z716 Tobacco abuse counseling: Secondary | ICD-10-CM

## 2016-07-14 DIAGNOSIS — G479 Sleep disorder, unspecified: Secondary | ICD-10-CM | POA: Insufficient documentation

## 2016-07-14 DIAGNOSIS — M5126 Other intervertebral disc displacement, lumbar region: Secondary | ICD-10-CM | POA: Insufficient documentation

## 2016-07-14 DIAGNOSIS — F17210 Nicotine dependence, cigarettes, uncomplicated: Secondary | ICD-10-CM | POA: Diagnosis not present

## 2016-07-14 DIAGNOSIS — M545 Low back pain, unspecified: Secondary | ICD-10-CM

## 2016-07-14 DIAGNOSIS — M542 Cervicalgia: Secondary | ICD-10-CM | POA: Insufficient documentation

## 2016-07-14 DIAGNOSIS — M25512 Pain in left shoulder: Secondary | ICD-10-CM | POA: Diagnosis not present

## 2016-07-14 DIAGNOSIS — M549 Dorsalgia, unspecified: Secondary | ICD-10-CM | POA: Diagnosis present

## 2016-07-14 DIAGNOSIS — Z720 Tobacco use: Secondary | ICD-10-CM | POA: Diagnosis not present

## 2016-07-14 DIAGNOSIS — G8929 Other chronic pain: Secondary | ICD-10-CM

## 2016-07-14 MED ORDER — TRAMADOL HCL 50 MG PO TABS: 100.0000 mg | ORAL_TABLET | Freq: Four times a day (QID) | ORAL | 0 refills | Status: AC | PRN

## 2016-07-14 NOTE — Progress Notes (Signed)
Subjective:    Patient ID: Richard Allison, male    DOB: 1985/05/12, 31 y.o.   MRN: [id number]  HPI 31 y/o male with pmh of tobacco abuse, back pain s/p lumbar fusion presents for follow up of neck > back pain.   Initially stated: Neck pain located on left side.  Back pain on right side. Started in 2001 after heavy lifting.  Back surgery was in 2009. Neck is getting progressively worse since 2012.  Prolonged activity exacerbate the pain.  Chiropractor improves the pain. Sharp pain.  Pain radiates to elbow.  Before going to the chiropractor is travel down to his hand.  Constant.  Ice helps.  Flexaril helps pt go to sleep.  Denies associated numbness, tingling, weakness.  This has resulted in poor sleep. It limits him from doing daily activities and working. It also limits his interactions with others. Pt had C-spine MRI 06/2014 and L-spine 05/2015. He has had a repeat MRI and was referred to Neurosurg. Pt has now failed 2 UDS and was informed, he would not be prescribed narcotics.   Last clinic visit 06/15/16.  Since last visit, pt notes his wife left him.  He started pool therapy last week.  Pt states he is no longer interested in pool therapy.  He is upset that his wife left. Encouraged patient to follow up for ESIs.  States he has anger management issues.   Pain Inventory Average Pain 8 Pain Right Now 10 My pain is sharp, burning, dull, stabbing and aching  In the last 24 hours, has pain interfered with the following? General activity 7 Relation with others 7 Enjoyment of life 7 What TIME of day is your pain at its worst? morning evening and night Sleep (in general) Poor  Pain is worse with: walking, bending, sitting, inactivity, standing and some activites Pain improves with: rest and medication Relief from Meds: 8  Mobility walk without assistance how many minutes can you walk? 30-45 ability to climb steps?  yes do you drive?  yes Do you have any goals in this area?   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  no  Neuro/Psych No problems in this area  Prior Studies Any changes since last visit?  no  Physicians involved in your care Any changes since last visit?  no   Family History  Problem Relation Age of Onset  . Heart disease Father   . Diabetes Father   . Hyperlipidemia Father    Social History   Social History  . Marital status: Legally Separated    Spouse name: N/A  . Number of children: N/A  . Years of education: N/A   Social History Main Topics  . Smoking status: Current Every Day Smoker    Packs/day: 1.00    Types: Cigarettes  . Smokeless tobacco: Current User  . Alcohol use No  . Drug use: No  . Sexual activity: Yes    Birth control/ protection: None   Other Topics Concern  . None   Social History Narrative  . None   Past Surgical History:  Procedure Laterality Date  . BACK SURGERY     No pertinent past medical history. BP 126/88   Pulse (!) 110   SpO2 96%   Opioid Risk Score:   Fall Risk Score:  `1  Depression screen PHQ 2/9  Depression screen PHQ  2/9 05/18/2016 12/16/2015 10/22/2015 07/31/2015  Decreased Interest 2 2 2 2   Down, Depressed, Hopeless 2 2 2 2   PHQ - 2 Score 4 4 4 4   Altered sleeping - - - 3  Tired, decreased energy - - - 3  Change in appetite - - - 2  Feeling bad or failure about yourself  - - - 3  Trouble concentrating - - - 2  Moving slowly or fidgety/restless - - - 2  Suicidal thoughts - - - 0  PHQ-9 Score - - - 19  Difficult doing work/chores - - - Extremely dIfficult    Review of Systems  Constitutional: Negative.   HENT: Negative.   Eyes: Negative.   Respiratory: Negative.   Cardiovascular: Negative.   Gastrointestinal: Negative.   Endocrine: Negative.   Genitourinary: Negative.   Musculoskeletal: Positive for back pain and neck pain.  Skin: Negative.    Allergic/Immunologic: Negative.   Neurological: Negative.   Hematological: Negative.   Psychiatric/Behavioral: Negative.   All other systems reviewed and are negative.      Objective:   Physical Exam Gen: NAD. Vital signs reviewed HENT: Normocephalic, Atraumatic Eyes: EOMI, No discharge.  Cardio: RRR. No JVD. Pulm: B/l clear to auscultation.  Effort normal Abd: Soft, BS+ MSK:   Gait WNL.              +TTP over C-spine, PSP, and right upper trap.               No edema.  Neuro:   Strength 5/5 in all RUE myotomes    5/5 in all LUE myotomes Skin: Warm and Dry. Intact.  Psych: Upset, depressed.    Assessment & Plan:  31 y/o male with pmh of tobacco abuse, back pain s/p lumbar fusion presents for follow up of neck > back pain.  1. Chronic cervical neck pain             MRI 08/2015, showing Left-sided disc and osteophyte complex at C5-6 similar to the prior study. This is causing cord flattening and mild spinal stenosis with mild-to-moderate foraminal encroachment. No change from the prior MRI. Central and left-sided disc protrusion with left-sided spurring at C6-7 unchanged from the prior study. Chronic mild left foraminal narrowing unchanged. Mild spinal stenosis             Unable to tolerate Mobic due to GI bleeding  Can't take Gabapentin and Lyrica due to drowsiness             Cymbalta 60 makes pt too fatigued             Pt went to PT for 1 visit             Cont aquatic therapy             Cont Ice             TENs effective, encouraged pt to obtain again, currently seeing a chiropractor, who performs TENS  Elavil switched to Nortryptaline, but pt only occasionally taking due to sedation.  He has not tried the Nortryptaline  Baclofen changed to 10 TID PRN  D/ced Robaxin 500 TID PRN due cost and lack of coverage  D/ced Flexaril qHS             Tramadol works most of the time, refilled, 100 4/day PRN, refilled  Cont Ibu 600 TID PRN  UDS inconsistent x2, will not prescribe  narcotics  Referral to for ESIs under sedation,  pt needs to call to make appointment - still has not made and now is not interested, encouraged to make appointment.  Will give patient 1 month to address social issues, then needs to make appointment.              Pt saw Surgery for eval, who also recommended ESIs  Encouraged referral to Psychology for pain and anger management, but patient not interested.                  2. Chronic Mechanic low back pain             MRI 05/2015: 1. Posterior lumbar interbody fusion from L4 through S1 without failure or complication. No foraminal or central canal stenosis. 2. At L2-3, there is a small left paracentral disc protrusion.  See above  3. Tobacco abuse             Educated and encouraged on cessation again, 1/2 PPD.  4. Sleep disturbance             Cont activity             Nortryptaline as needed  Trazodone 50qhs PRN, decreased to 25mg  qHS PRN, d/ced due to side effects - pt states he gets headaches with it.               Cont mattress soon              5. 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

## 2016-08-02 ENCOUNTER — Telehealth: Payer: Self-pay | Admitting: *Deleted

## 2016-08-02 NOTE — Telephone Encounter (Signed)
If patient sustained a serious injury, he needs to be evaluated and possibly have imaging.  I cannot write for a prescription without evaluation.  Thanks.

## 2016-08-02 NOTE — Telephone Encounter (Signed)
Patient left a message stating that he has injured his back on 07/31/2016.  He states that he cannot walk, cannot get out of bed, cannot do anything right now. In excruciating pain! He is asking, wondering if Dr. [name] would write a one week script for pain medication to help him get through this? Or something like that?....Marland Kitchen.Marland Kitchen.Please advise on how to respond

## 2016-08-02 NOTE — Telephone Encounter (Signed)
I contacted the patient and offered to move him up to Thursday the 6th of July.  He declined and said he would keep his appointment on the 12th of July

## 2016-08-10 ENCOUNTER — Emergency Department (HOSPITAL_COMMUNITY)
Admission: EM | Admit: 2016-08-10 | Discharge: 2016-08-10 | Disposition: A | Discharge status: Home / Self Care | Payer: Medicaid Other | Attending: Emergency Medicine | Admitting: Emergency Medicine

## 2016-08-10 ENCOUNTER — Encounter (HOSPITAL_COMMUNITY): Payer: Self-pay | Admitting: *Deleted

## 2016-08-10 ENCOUNTER — Emergency Department (HOSPITAL_COMMUNITY): Payer: Medicaid Other

## 2016-08-10 DIAGNOSIS — Y999 Unspecified external cause status: Secondary | ICD-10-CM | POA: Diagnosis not present

## 2016-08-10 DIAGNOSIS — R45851 Suicidal ideations: Secondary | ICD-10-CM | POA: Diagnosis not present

## 2016-08-10 DIAGNOSIS — Y9389 Activity, other specified: Secondary | ICD-10-CM | POA: Diagnosis not present

## 2016-08-10 DIAGNOSIS — S80912A Unspecified superficial injury of left knee, initial encounter: Secondary | ICD-10-CM | POA: Diagnosis present

## 2016-08-10 DIAGNOSIS — Y9289 Other specified places as the place of occurrence of the external cause: Secondary | ICD-10-CM | POA: Diagnosis not present

## 2016-08-10 DIAGNOSIS — W25XXXA Contact with sharp glass, initial encounter: Secondary | ICD-10-CM | POA: Insufficient documentation

## 2016-08-10 DIAGNOSIS — F17210 Nicotine dependence, cigarettes, uncomplicated: Secondary | ICD-10-CM | POA: Diagnosis not present

## 2016-08-10 DIAGNOSIS — S81012A Laceration without foreign body, left knee, initial encounter: Secondary | ICD-10-CM | POA: Diagnosis not present

## 2016-08-10 DIAGNOSIS — Z23 Encounter for immunization: Secondary | ICD-10-CM | POA: Diagnosis not present

## 2016-08-10 LAB — CBC WITH DIFFERENTIAL/PLATELET
BASOS ABS: 0 10*3/uL (ref 0.0–0.1)
Basophils Relative: 0 %
Eosinophils Absolute: 0.3 10*3/uL (ref 0.0–0.7)
Eosinophils Relative: 3 %
HEMATOCRIT: 40.3 % (ref 39.0–52.0)
HEMOGLOBIN: 13.8 g/dL (ref 13.0–17.0)
LYMPHS PCT: 32 %
Lymphs Abs: 2.6 10*3/uL (ref 0.7–4.0)
MCH: 29.6 pg (ref 26.0–34.0)
MCHC: 34.2 g/dL (ref 30.0–36.0)
MCV: 86.3 fL (ref 78.0–100.0)
Monocytes Absolute: 0.7 10*3/uL (ref 0.1–1.0)
Monocytes Relative: 8 %
NEUTROS ABS: 4.8 10*3/uL (ref 1.7–7.7)
NEUTROS PCT: 57 %
Platelets: 245 10*3/uL (ref 150–400)
RBC: 4.67 MIL/uL (ref 4.22–5.81)
RDW: 13.4 % (ref 11.5–15.5)
WBC: 8.3 10*3/uL (ref 4.0–10.5)

## 2016-08-10 LAB — BASIC METABOLIC PANEL
ANION GAP: 13 (ref 5–15)
BUN: 14 mg/dL (ref 6–20)
CHLORIDE: 107 mmol/L (ref 101–111)
CO2: 22 mmol/L (ref 22–32)
Calcium: 9.4 mg/dL (ref 8.9–10.3)
Creatinine, Ser: 1.05 mg/dL (ref 0.61–1.24)
GFR calc Af Amer: >60 mL/min (ref >60)
GLUCOSE: 92 mg/dL (ref 65–99)
POTASSIUM: 4.1 mmol/L (ref 3.5–5.1)
Sodium: 142 mmol/L (ref 135–145)

## 2016-08-10 LAB — ETHANOL: Alcohol, Ethyl (B): <5 mg/dL (ref <5)

## 2016-08-10 MED ORDER — LIDOCAINE-EPINEPHRINE (PF) 1 %-1:200000 IJ SOLN
20.0000 mL | Freq: Once | INTRAMUSCULAR | Status: AC
  Administered 2016-08-10: 20 mL
  Filled 2016-08-10: qty 30

## 2016-08-10 MED ORDER — TETANUS-DIPHTH-ACELL PERTUSSIS 5-2.5-18.5 LF-MCG/0.5 IM SUSP
0.5000 mL | Freq: Once | INTRAMUSCULAR | Status: AC
  Administered 2016-08-10: 0.5 mL via INTRAMUSCULAR
  Filled 2016-08-10: qty 0.5

## 2016-08-10 NOTE — ED Notes (Signed)
[facility] remain with patient, as he is currently in custody.

## 2016-08-10 NOTE — BH Assessment (Signed)
Tele Assessment Note   Richard Allison is an 31 y.o. male.  -Clinician reviewed note by Dr. Wilkie Aye.  This is a 31 year old male who presents in police custody with a left knee laceration. Ordered by police to be breaking into a store. He cut his left knee on glass. Tetanus is not up-to-date. He has normal range of motion. Patient states that he feels suicidal. He reports attempting to hang himself last week and cutting his wrist. No prior hospitalizations for mental health issues or suicide in the past.  He reports increased stressors at home and being essentially homeless and living in a tent. He states "I know I did something stupid but did not want to sleep in a tent tonight. He denies any alcohol or drug use. He does take Xanax.  Patient says that he had broken the glass window of a gas station because "I was mad and wanted to break something."  Patient said that he has been thought a lot lately.  Fiance put a restraining order on him on 07-14-16. This necessitated him moving out and not being able to see his 33 year old and 3 year old.sons.  Patient says that he has tried to hang himself 2 weeks ago; tried to OD on tramadole a few days later; made cuts to his wrists 2 days ago.    Patient had expected fiance to get the restraining order dismissed.  He said that he has lost everything.  He has been living in a tent (w/o a air mattress).  Patient said he feels that no matter what he does, he gets knocked back down.  Patient smokes marijuana daily.  Roughly 2 bowls per day.    Patient has no hx of inpatient or outpatient psychiatric care.  -Clinician discussed patient care with Donell Sievert, PA who recommended that if patient is under custody of [facility] to have him on suicide watch with the [facility] jail.  Clinician discussed with Dr. Wilkie Aye who is in agreement with disposition.    Diagnosis: MDD recurrent, severe; Cannabis abuse d/o severe  Past Medical History: History reviewed. No  pertinent past medical history.  Past Surgical History:  Procedure Laterality Date  . BACK SURGERY      Family History:  Family History  Problem Relation Age of Onset  . Heart disease Father   . Diabetes Father   . Hyperlipidemia Father     Social History:  reports that he has been smoking Cigarettes.  He has been smoking about 1.00 pack per day. He uses smokeless tobacco. He reports that he does not drink alcohol or use drugs.  Additional Social History:  Alcohol / Drug Use Pain Medications: Tramadole (chronic back and neck pain) Prescriptions: No psychiatric meds Over the Counter: None History of alcohol / drug use?: Yes Substance #1 Name of Substance 1: Marijuana 1 - Age of First Use: Teenager 1 - Amount (size/oz): "A couple bowls a day." 1 - Frequency: Daily 1 - Duration: on-going 1 - Last Use / Amount: 07/10  CIWA:   COWS:    PATIENT STRENGTHS: (choose at least two) Ability for insight Average or above average intelligence Capable of independent living Communication skills Work skills  Allergies:  Allergies  Allergen Reactions  . Food Nausea And Vomiting and Other (See Comments)    Coconut oil   Causes n/v and fever  . Nsaids     GI BLEED   . Tylenol [Acetaminophen] Other (See Comments)    GI upset  Home Medications:  (Not in a hospital admission)  OB/GYN Status:  No LMP for male patient.  General Assessment Data Location of Assessment: [facility] TTS Assessment: In system Is this a Tele or Face-to-Face Assessment?: Tele Assessment Is this an Initial Assessment or a Re-assessment for this encounter?: Initial Assessment Marital status: Single Is patient pregnant?: No Pregnancy Status: No Living Arrangements: Other (Comment) (Pt currently homeless) Can pt return to current living arrangement?: Yes Admission Status: Voluntary Is patient capable of signing voluntary admission?: Yes Referral Source: Self/Family/Friend Insurance type: MCD      Crisis Care Plan Living Arrangements: Other (Comment) (Pt currently homeless) Name of Psychiatrist: None Name of Therapist: None  Education Status Is patient currently in school?: No Highest grade of school patient has completed: 2 year associate's degree  Risk to self with the past 6 months Suicidal Ideation: No-Not Currently/Within Last 6 Months Has patient been a risk to self within the past 6 months prior to admission? : Yes Suicidal Intent: No-Not Currently/Within Last 6 Months Has patient had any suicidal intent within the past 6 months prior to admission? : Yes Is patient at risk for suicide?: No Suicidal Plan?: No-Not Currently/Within Last 6 Months Has patient had any suicidal plan within the past 6 months prior to admission? : Yes Access to Means: Yes Specify Access to Suicidal Means: Rope, medication, sharps What has been your use of drugs/alcohol within the last 12 months?: THC Previous Attempts/Gestures: Yes How many times?:  (3 in the last two weeks.) Other Self Harm Risks: None Triggers for Past Attempts: Spouse contact Intentional Self Injurious Behavior: None Family Suicide History: No Recent stressful life event(s): Conflict (Comment) (Conflict with fiance) Persecutory voices/beliefs?: Yes Depression: Yes Depression Symptoms: Despondent, Isolating, Guilt, Loss of interest in usual pleasures, Feeling worthless/self pity, Insomnia Substance abuse history and/or treatment for substance abuse?: Yes Suicide prevention information given to non-admitted patients: Not applicable  Risk to Others within the past 6 months Homicidal Ideation: No Does patient have any lifetime risk of violence toward others beyond the six months prior to admission? : No Thoughts of Harm to Others: No Current Homicidal Intent: No Current Homicidal Plan: No Access to Homicidal Means: No Identified Victim: No one History of harm to others?: Yes Assessment of Violence: In distant  past Violent Behavior Description: Hx of fights in school Does patient have access to weapons?: No Criminal Charges Pending?: Yes Describe Pending Criminal Charges: B&E Does patient have a court date: No Is patient on probation?: No  Psychosis Hallucinations: None noted Delusions: None noted  Mental Status Report Appearance/Hygiene: Disheveled, In scrubs Eye Contact: Good Motor Activity: Freedom of movement, Unremarkable Speech: Logical/coherent Level of Consciousness: Alert Mood: Depressed, Apprehensive, Sad Affect: Sad Anxiety Level: Panic Attacks Panic attack frequency: 3 in the past month Most recent panic attack: Tonight Thought Processes: Coherent, Relevant Judgement: Unimpaired Orientation: Person, Place, Situation Obsessive Compulsive Thoughts/Behaviors: None  Cognitive Functioning Concentration: Normal Memory: Recent Impaired, Remote Intact IQ: Average Insight: Poor Impulse Control: Poor Appetite: Poor Weight Loss:  (<1 meal per day) Weight Gain: 0 Sleep: No Change Total Hours of Sleep: 0 Vegetative Symptoms: None  ADLScreening [facility] Assessment Services) Patient's cognitive ability adequate to safely complete daily activities?: Yes Patient able to express need for assistance with ADLs?: Yes Independently performs ADLs?: Yes (appropriate for developmental age)  Prior Inpatient Therapy Prior Inpatient Therapy: No Prior Therapy Dates: None Prior Therapy Facilty/Provider(s): None Reason for Treatment: None  Prior Outpatient Therapy Prior Outpatient Therapy: No  Prior Therapy Dates: N/A Prior Therapy Facilty/Provider(s): N/A Reason for Treatment: N/A Does patient have an ACCT team?: No Does patient have Intensive In-House Services?  : No Does patient have [facility] services? : No Does patient have P4CC services?: No  ADL Screening (condition at time of admission) Patient's cognitive ability adequate to safely complete daily activities?: Yes Is the  patient deaf or have difficulty hearing?: No Does the patient have difficulty seeing, even when wearing glasses/contacts?: No Does the patient have difficulty concentrating, remembering, or making decisions?: No Patient able to express need for assistance with ADLs?: Yes Does the patient have difficulty dressing or bathing?: No Independently performs ADLs?: Yes (appropriate for developmental age) Does the patient have difficulty walking or climbing stairs?: No Weakness of Legs: None Weakness of Arms/Hands: None       Abuse/Neglect Assessment (Assessment to be complete while patient is alone) Physical Abuse: Denies Verbal Abuse: Yes, past (Comment) (Patient had some emotional abuse.) Sexual Abuse: Denies Exploitation of patient/patient's resources: Denies Self-Neglect: Denies     Merchant navy officerAdvance Directives (For Healthcare) Does Patient Have a Medical Advance Directive?: No Would patient like information on creating a medical advance directive?: No - Patient declined    Additional Information 1:1 In Past 12 Months?: No CIRT Risk: No Elopement Risk: No Does patient have medical clearance?: Yes     Disposition:  Disposition Initial Assessment Completed for this Encounter: Yes Disposition of Patient: Other dispositions (Pt to go to [facility] custody) Other disposition(s): Other (Comment) (Pt to be on suicide watch in jail)  Alexandria LodgeHarvey, Richard Allison 08/10/2016 4:24 AM

## 2016-08-10 NOTE — Discharge Instructions (Signed)
You were seen today for a laceration sustained. You also endorsed suicidal ideation.  You're evaluated by behavioral health colleagues. They recommend suicide watch while in prison.

## 2016-08-10 NOTE — ED Provider Notes (Signed)
[facility]-EMERGENCY DEPT Provider Note   CSN: [id number] Arrival date & time: 08/10/16  0158     History   Chief Complaint Chief Complaint  Patient presents with  . Laceration    HPI Richard Allison is a 31 y.o. male.  HPI  This is a 31 year old male who presents in police custody with a left knee laceration. Ordered by police to be breaking into a store. He cut his left knee on glass. Tetanus is not up-to-date. He has normal range of motion. Patient general states that he feel suicidal. He reports attempting to hang himself last week and cutting his wrist. No prior hospitalizations for mental health issues or suicide in the past.  He reports increased stressors at home and being essentially homeless and living in a tent. He states "I know I did something stupid but did not want to sleep in a tent tonight. He denies any alcohol or drug use. He does take Xanax.  History reviewed. No pertinent past medical history.  Patient Active Problem List   Diagnosis Date Noted  . Chronic neck pain 07/31/2015  . Chronic back pain 07/31/2015  . Sleep disturbance 07/31/2015  . Myalgia and myositis 07/31/2015  . Tobacco abuse 07/31/2015  . Tobacco abuse counseling 07/31/2015    Past Surgical History:  Procedure Laterality Date  . BACK SURGERY         Home Medications    Prior to Admission medications   Medication Sig Start Date End Date Taking? Authorizing Provider  baclofen (LIORESAL) 10 MG tablet TK 1 T PO 2 TO 3 TIMES D PRF SPASM 01/13/16  Yes [provider]  traMADol (ULTRAM) 50 MG tablet Take 2 tablets (100 mg total) by mouth every 6 (six) hours as needed. 07/14/16  Yes Marcello FennelPatel, Ankit Anil, MD  ibuprofen (IBU) 600 MG tablet Take 1 tablet (600 mg total) by mouth every 8 (eight) hours as needed. 06/15/16   Marcello FennelPatel, Ankit Anil, MD  methocarbamol (ROBAXIN) 500 MG tablet Take 1 tablet (500 mg total) by mouth every 8 (eight) hours as needed for muscle spasms. 01/28/16   Marcello FennelPatel,  Ankit Anil, MD    Family History Family History  Problem Relation Age of Onset  . Heart disease Father   . Diabetes Father   . Hyperlipidemia Father     Social History Social History  Substance Use Topics  . Smoking status: Current Every Day Smoker    Packs/day: 1.00    Types: Cigarettes  . Smokeless tobacco: Current User  . Alcohol use No     Allergies   Food; Nsaids; and Tylenol [acetaminophen]   Review of Systems Review of Systems  Skin: Positive for wound.  Psychiatric/Behavioral: Positive for self-injury and suicidal ideas.  All other systems reviewed and are negative.    Physical Exam Updated Vital Signs BP 134/84 (BP Location: Left Arm)   Pulse 94   Resp 17   Ht 6' (1.829 m)   Wt 86.2 kg (190 lb)   SpO2 98%   BMI 25.77 kg/m   Physical Exam  Constitutional: He is oriented to person, place, and time. He appears well-developed and well-nourished. No distress.  HENT:  Head: Normocephalic and atraumatic.  Cardiovascular: Normal rate, regular rhythm and normal heart sounds.   No murmur heard. Pulmonary/Chest: Effort normal and breath sounds normal. No respiratory distress. He has no wheezes.  Abdominal: Soft. Bowel sounds are normal. There is no tenderness. There is no rebound.  Musculoskeletal: He exhibits no edema.  Normal range of motion of the left knee, 2 cm laceration mildly gaping  Neurological: He is alert and oriented to person, place, and time.  Skin: Skin is warm and dry.  Superficial lacerations left wrist, occasionally Multiple abrasions bilateral lower extremities  Psychiatric: He has a normal mood and affect.  Nursing note and vitals reviewed.    ED Treatments / Results  Labs (all labs ordered are listed, but only abnormal results are displayed) Labs Reviewed  CBC WITH DIFFERENTIAL/PLATELET  BASIC METABOLIC PANEL  ETHANOL  RAPID URINE DRUG SCREEN, HOSP PERFORMED    EKG  EKG Interpretation None       Radiology Dg Knee  Complete 4 Views Left  Result Date: 08/10/2016 CLINICAL DATA:  Cut leg on piece of glass EXAM: LEFT KNEE - COMPLETE 4+ VIEW COMPARISON:  None. FINDINGS: No evidence of fracture, dislocation, or joint effusion. No evidence of arthropathy or other focal bone abnormality. Small soft tissue laceration anterior to the patella. IMPRESSION: No acute osseous abnormality. Electronically Signed   By: Jasmine Pang M.D.   On: 08/10/2016 02:40    Procedures Procedures (including critical care time)  LACERATION REPAIR Performed by: Shon Baton Authorized by: Shon Baton Consent: Verbal consent obtained. Risks and benefits: risks, benefits and alternatives were discussed Consent given by: patient Patient identity confirmed: provided demographic data Prepped and Draped in normal sterile fashion Wound explored  Laceration Location: left knee  Laceration Length: 2cm  No Foreign Bodies seen or palpated  Anesthesia: local infiltration  Local anesthetic: lidocaine 1% w epinephrine  Anesthetic total: 2 ml  Irrigation method: syringe Amount of cleaning: standard  Skin closure: 4-0 nylon  Number of sutures: 3  Technique: interrupted  Patient tolerance: Patient tolerated the procedure well with no immediate complications.   Medications Ordered in ED Medications  Tdap (BOOSTRIX) injection 0.5 mL (0.5 mLs Intramuscular Given 08/10/16 0257)  lidocaine-EPINEPHrine (XYLOCAINE-EPINEPHrine) 1 %-1:200000 (PF) injection 20 mL (20 mLs Infiltration Given by Other 08/10/16 0259)     Initial Impression / Assessment and Plan / ED Course  I have reviewed the triage vital signs and the nursing notes.  Pertinent labs & imaging results that were available during my care of the patient were reviewed by me and considered in my medical decision making (see chart for details).     Patient presents with a laceration to the left knee after attempting to break into a convenience store. He now is  also endorsing suicidal ideation. He does have evidence of superficial lacerations to the left wrist: However, no history of mental health disorder or hospitalization for the same. Laceration was repaired. TTS did evaluate the patient. Given that he is under arrest and will be going to jail, they feel that he can be observed in jail under suicide watch. I have relayed this information to the deputies at the bedside recommending suicide watch while in jail. They stated understanding.  After history, exam, and medical workup I feel the patient has been appropriately medically screened and is safe for discharge home. Pertinent diagnoses were discussed with the patient. Patient was given return precautions.   Final Clinical Impressions(s) / ED Diagnoses   Final diagnoses:  Knee laceration, left, initial encounter  Suicidal ideation    New Prescriptions Discharge Medication List as of 08/10/2016  4:23 AM       Wilkie Aye, Mayer Masker, MD 08/10/16 [phone]

## 2016-08-10 NOTE — ED Triage Notes (Signed)
Pt arrived in [employer] custody. Pt cut his leg trying to break into store. Pt says cut leg on a piece of glass.

## 2016-08-10 NOTE — ED Notes (Signed)
ED Provider at bedside for suture repair 

## 2016-08-10 NOTE — ED Notes (Signed)
Pt clothes placed in bag and given to [employer], officer Clovis RileyMitchell per request. Pt given blue paper scrubs to wear.

## 2016-08-11 ENCOUNTER — Encounter: Payer: Medicaid Other | Attending: Physical Medicine & Rehabilitation | Admitting: Physical Medicine & Rehabilitation

## 2016-08-11 DIAGNOSIS — G894 Chronic pain syndrome: Secondary | ICD-10-CM | POA: Insufficient documentation

## 2016-08-11 DIAGNOSIS — M549 Dorsalgia, unspecified: Secondary | ICD-10-CM | POA: Insufficient documentation

## 2016-08-11 DIAGNOSIS — G479 Sleep disorder, unspecified: Secondary | ICD-10-CM | POA: Insufficient documentation

## 2016-08-11 DIAGNOSIS — M542 Cervicalgia: Secondary | ICD-10-CM | POA: Insufficient documentation

## 2016-08-11 DIAGNOSIS — F17210 Nicotine dependence, cigarettes, uncomplicated: Secondary | ICD-10-CM | POA: Insufficient documentation

## 2016-08-11 DIAGNOSIS — M4802 Spinal stenosis, cervical region: Secondary | ICD-10-CM | POA: Insufficient documentation

## 2016-08-11 DIAGNOSIS — M5126 Other intervertebral disc displacement, lumbar region: Secondary | ICD-10-CM | POA: Insufficient documentation

## 2016-08-11 DIAGNOSIS — Z981 Arthrodesis status: Secondary | ICD-10-CM | POA: Insufficient documentation

## 2016-08-11 DIAGNOSIS — M791 Myalgia: Secondary | ICD-10-CM | POA: Insufficient documentation

## 2017-12-02 IMAGING — DX DG KNEE COMPLETE 4+V*L*
4 series · 4 of 4 positions shown · non-contrast
Comparison: None.

CLINICAL DATA: Cut leg on piece of glass

EXAM:
LEFT KNEE - COMPLETE 4+ VIEW

[knee ap (1 of 3)]
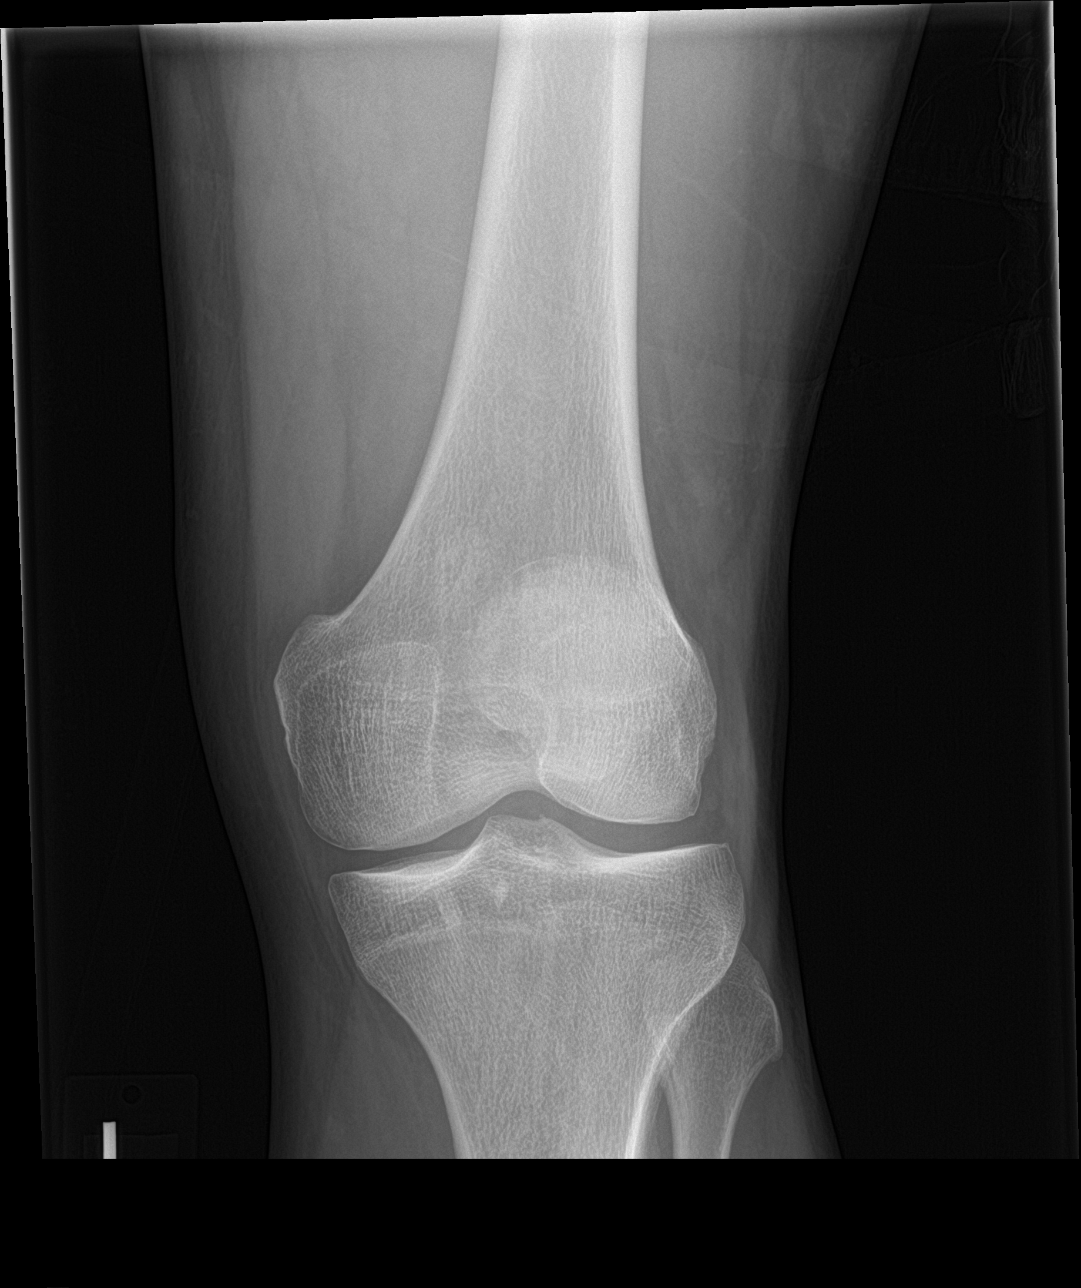

[knee lat]
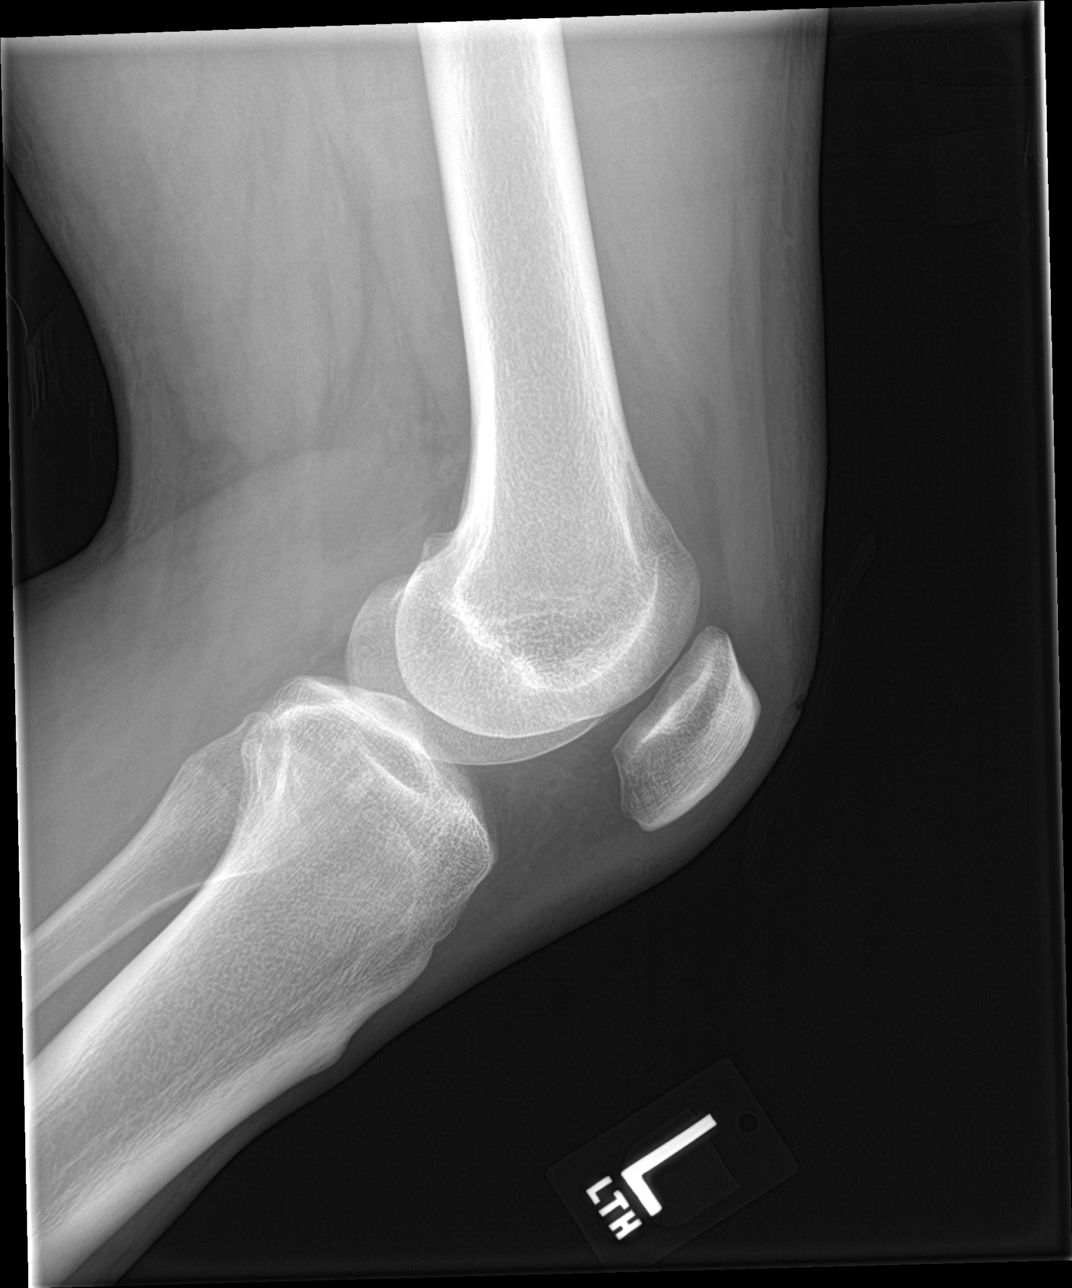

[knee ap (2 of 3)]
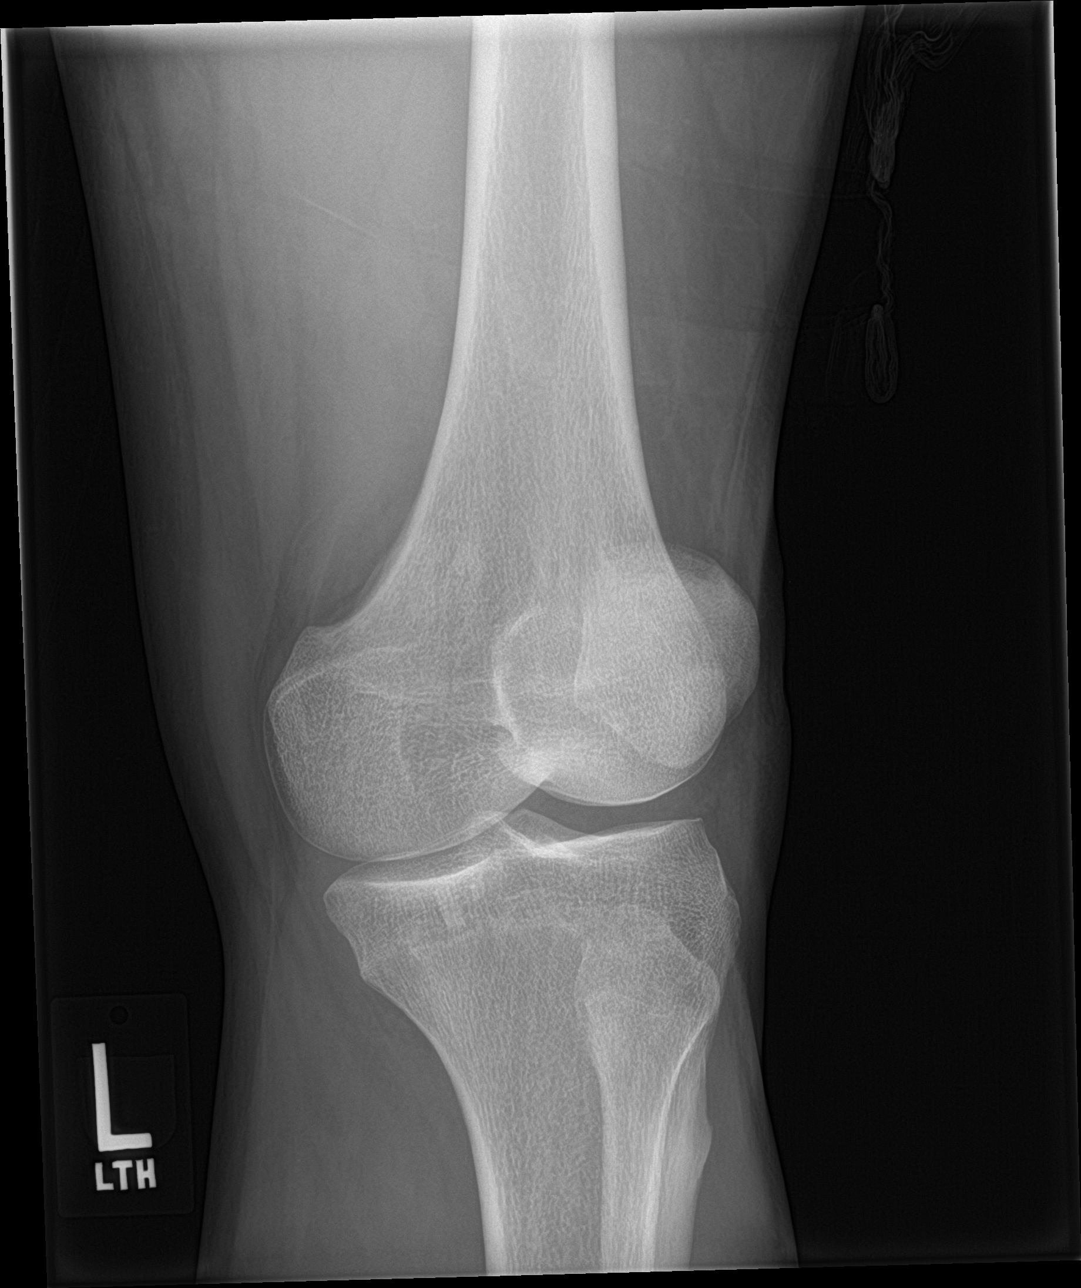

[knee ap (3 of 3)]
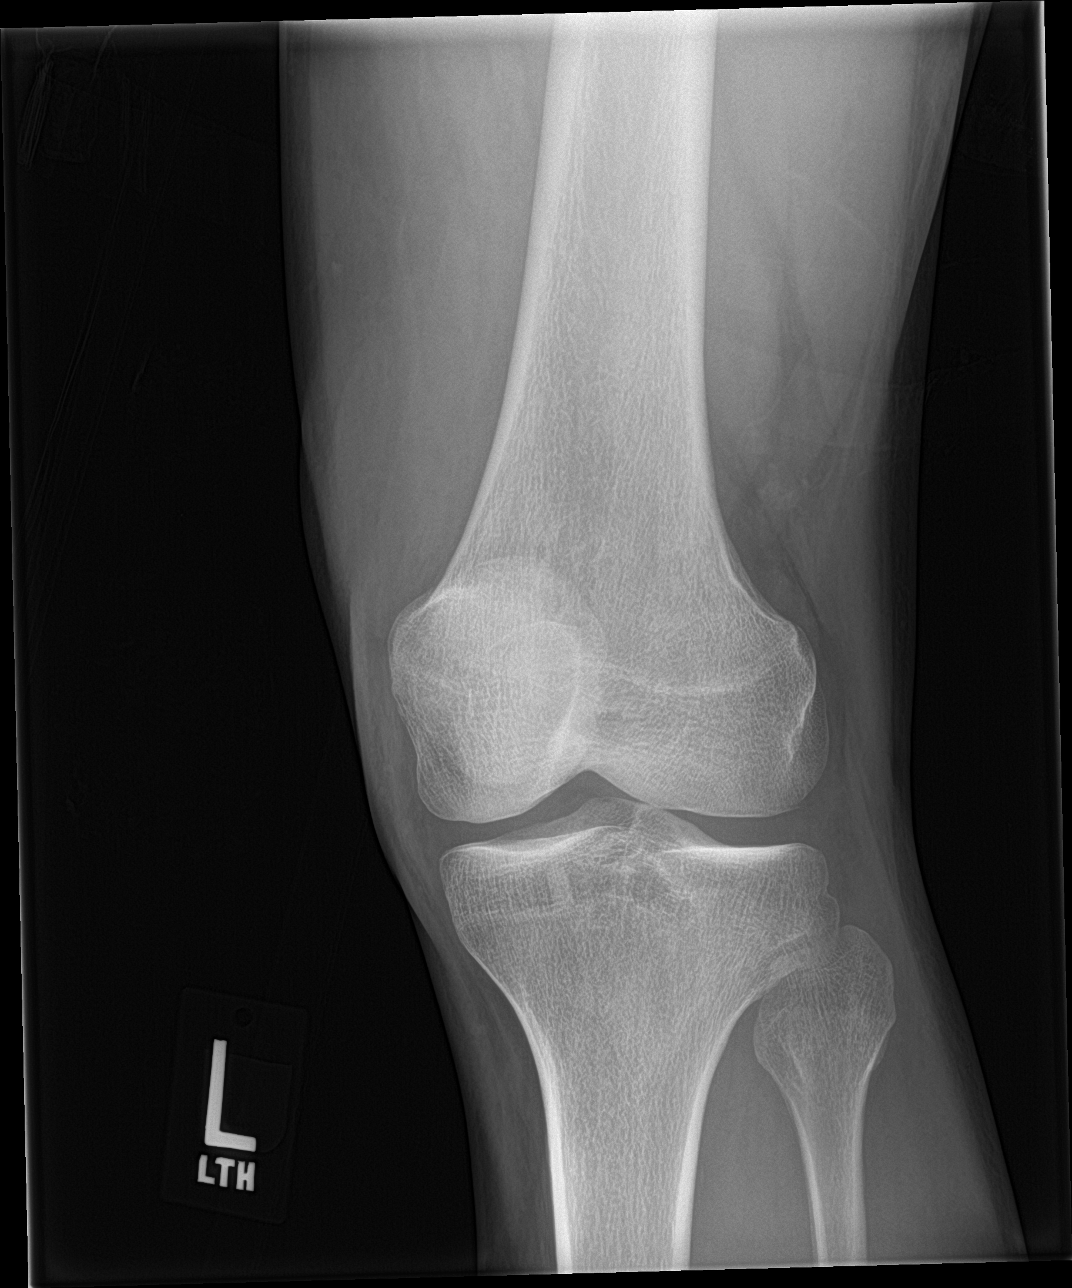

[4 of 4 positions shown; findings below may reference images not displayed]

FINDINGS: No evidence of fracture, dislocation, or joint effusion. No evidence
of arthropathy or other focal bone abnormality. Small soft tissue
laceration anterior to the patella.
IMPRESSION: No acute osseous abnormality.
